# Patient Record
Sex: Male | Born: 1975 | Race: Black or African American | Hispanic: No | Marital: Single | Smoking: Current some day smoker
Health system: Southern US, Community
[De-identification: ages and names within clinical notes are randomized; demographics above are authoritative.]

## PROBLEM LIST (undated history)

## (undated) DIAGNOSIS — I10 Essential (primary) hypertension: Secondary | ICD-10-CM

## (undated) DIAGNOSIS — I2510 Atherosclerotic heart disease of native coronary artery without angina pectoris: Secondary | ICD-10-CM

## (undated) DIAGNOSIS — J40 Bronchitis, not specified as acute or chronic: Secondary | ICD-10-CM

## (undated) HISTORY — PX: CHOLECYSTECTOMY: SHX55

---

## 2017-03-16 ENCOUNTER — Encounter (HOSPITAL_COMMUNITY): Payer: Self-pay

## 2017-03-16 DIAGNOSIS — R42 Dizziness and giddiness: Secondary | ICD-10-CM | POA: Insufficient documentation

## 2017-03-16 DIAGNOSIS — Z5321 Procedure and treatment not carried out due to patient leaving prior to being seen by health care provider: Secondary | ICD-10-CM | POA: Insufficient documentation

## 2017-03-16 LAB — CBC
HCT: 39.4 % (ref 39.0–52.0)
HEMOGLOBIN: 12.9 g/dL — AB (ref 13.0–17.0)
MCH: 27.2 pg (ref 26.0–34.0)
MCHC: 32.7 g/dL (ref 30.0–36.0)
MCV: 82.9 fL (ref 78.0–100.0)
PLATELETS: 303 10*3/uL (ref 150–400)
RBC: 4.75 MIL/uL (ref 4.22–5.81)
RDW: 13.7 % (ref 11.5–15.5)
WBC: 10 10*3/uL (ref 4.0–10.5)

## 2017-03-16 LAB — BASIC METABOLIC PANEL
ANION GAP: 7 (ref 5–15)
BUN: 11 mg/dL (ref 6–20)
CHLORIDE: 103 mmol/L (ref 101–111)
CO2: 29 mmol/L (ref 22–32)
CREATININE: 1.02 mg/dL (ref 0.61–1.24)
Calcium: 9.4 mg/dL (ref 8.9–10.3)
GFR calc non Af Amer: >60 mL/min (ref >60)
Glucose, Bld: 170 mg/dL — ABNORMAL HIGH (ref 65–99)
Potassium: 3.8 mmol/L (ref 3.5–5.1)
SODIUM: 139 mmol/L (ref 135–145)

## 2017-03-16 LAB — URINALYSIS, ROUTINE W REFLEX MICROSCOPIC
Bacteria, UA: NONE SEEN
Bilirubin Urine: NEGATIVE
Glucose, UA: NEGATIVE mg/dL
Ketones, ur: NEGATIVE mg/dL
LEUKOCYTES UA: NEGATIVE
Nitrite: NEGATIVE
PROTEIN: 30 mg/dL — AB
Specific Gravity, Urine: 1.02 (ref 1.005–1.030)
pH: 5 (ref 5.0–8.0)

## 2017-03-16 LAB — I-STAT TROPONIN, ED: TROPONIN I, POC: 0 ng/mL (ref 0.00–0.08)

## 2017-03-16 NOTE — ED Triage Notes (Signed)
Pt states that he has been feeling dizzy for the past three week and CP started today along with SOB/nausea and dizziness. Pt states he just feels week and not good.

## 2017-03-17 ENCOUNTER — Ambulatory Visit (HOSPITAL_COMMUNITY)
Admission: EM | Admit: 2017-03-17 | Discharge: 2017-03-17 | Disposition: A | Discharge status: Home / Self Care | Payer: Self-pay | Attending: Family Medicine | Admitting: Family Medicine

## 2017-03-17 ENCOUNTER — Encounter (HOSPITAL_COMMUNITY): Payer: Self-pay | Admitting: Emergency Medicine

## 2017-03-17 ENCOUNTER — Emergency Department (HOSPITAL_COMMUNITY)
Admission: EM | Admit: 2017-03-17 | Discharge: 2017-03-17 | Disposition: A | Discharge status: Home / Self Care | Payer: Self-pay | Attending: Emergency Medicine | Admitting: Emergency Medicine

## 2017-03-17 DIAGNOSIS — J302 Other seasonal allergic rhinitis: Secondary | ICD-10-CM

## 2017-03-17 HISTORY — DX: Bronchitis, not specified as acute or chronic: J40

## 2017-03-17 MED ORDER — CETIRIZINE HCL 10 MG PO TABS: 10.0000 mg | ORAL_TABLET | Freq: Every day | ORAL | 0 refills | Status: DC

## 2017-03-17 NOTE — ED Notes (Addendum)
Pt asked about wait time. Pt updated

## 2017-03-17 NOTE — ED Triage Notes (Signed)
Pt was seen in the ED last night but LWBS after triage.  Pt had labs drawn and an EKG done.  Pt complains of feeling dizzy last night before work, then feeling numbness in his hands while at work.  Pt reports suffering from a cold for the last three weeks.

## 2017-03-17 NOTE — ED Notes (Signed)
Called for Pt to recheck vitals. No answer x's 4 

## 2017-03-17 NOTE — ED Provider Notes (Signed)
[facility]   [id number] 03/17/17 Arrival Time: 1002  ASSESSMENT & PLAN:  1. Seasonal allergic rhinitis, unspecified trigger     Meds ordered this encounter  Medications  . cetirizine (ZYRTEC) 10 MG tablet    Sig: Take 1 tablet (10 mg total) by mouth daily.    Dispense:  30 tablet    Refill:  0   Will f/u as needed. Reviewed expectations re: course of current medical issues. Questions answered. Outlined signs and symptoms indicating need for more acute intervention. Patient verbalized understanding. After Visit Summary given.   SUBJECTIVE:  Joel Velazquez is a 41 y.o. male who presented to the ED last evening with complaint of feeling lightheaded. Overall he reports 3-4 weeks.of on and off nasal congestion and runny nose. Afebrile. On and off fatigue. Sneezing frequently. Itchy eyes. No OTC treatment. Lightheaded feeling very transient yesterday. None today. Left ED before being seen. No CP/SOB. No specific aggravating or alleviating factors reported. Smokes cigarettes. ROS: As per HPI.   OBJECTIVE:  Vitals:   03/17/17 1021  BP: 133/80  Pulse: 77  Temp: 98.3 F (36.8 C)  TempSrc: Oral  SpO2: 100%     General appearance: alert; no distress Eyes: OS/OD conjunctivae with slight injection HEENT: nasal congestion; clear runny nose; throat irritation secondary to post-nasal drainage Neck: supple without LAD Lungs: clear to auscultation bilaterally Skin: warm and dry Psychological: alert and cooperative; normal mood and affect  Results for orders placed or performed during the hospital encounter of 03/17/17  Basic metabolic panel  Result Value Ref Range   Sodium 139 135 - 145 mmol/L   Potassium 3.8 3.5 - 5.1 mmol/L   Chloride 103 101 - 111 mmol/L   CO2 29 22 - 32 mmol/L   Glucose, Bld 170 (H) 65 - 99 mg/dL   BUN 11 6 - 20 mg/dL   Creatinine, Ser 1.02 0.61 - 1.24 mg/dL   Calcium 9.4 8.9 - 72.5 mg/dL   GFR calc non Af Amer >60 >60 mL/min   GFR calc Af  Amer >60 >60 mL/min   Anion gap 7 5 - 15  CBC  Result Value Ref Range   WBC 10.0 4.0 - 10.5 K/uL   RBC 4.75 4.22 - 5.81 MIL/uL   Hemoglobin 12.9 (L) 13.0 - 17.0 g/dL   HCT 36.6 44.0 - 34.7 %   MCV 82.9 78.0 - 100.0 fL   MCH 27.2 26.0 - 34.0 pg   MCHC 32.7 30.0 - 36.0 g/dL   RDW 42.5 95.6 - 38.7 %   Platelets 303 150 - 400 K/uL  Urinalysis, Routine w reflex microscopic  Result Value Ref Range   Color, Urine YELLOW YELLOW   APPearance CLEAR CLEAR   Specific Gravity, Urine 1.020 1.005 - 1.030   pH 5.0 5.0 - 8.0   Glucose, UA NEGATIVE NEGATIVE mg/dL   Hgb urine dipstick MODERATE (A) NEGATIVE   Bilirubin Urine NEGATIVE NEGATIVE   Ketones, ur NEGATIVE NEGATIVE mg/dL   Protein, ur 30 (A) NEGATIVE mg/dL   Nitrite NEGATIVE NEGATIVE   Leukocytes, UA NEGATIVE NEGATIVE   RBC / HPF 6-30 0 - 5 RBC/hpf   WBC, UA 0-5 0 - 5 WBC/hpf   Bacteria, UA NONE SEEN NONE SEEN   Squamous Epithelial / LPF 0-5 (A) NONE SEEN   Mucus PRESENT   I-Stat Troponin, ED (not at [facility])  Result Value Ref Range   Troponin i, poc 0.00 0.00 - 0.08 ng/mL   Comment 3  No Known Allergies  Past Medical History:  Diagnosis Date  . Bronchitis    Social History   Social History  . Marital status: Single    Spouse name: N/A  . Number of children: N/A  . Years of education: N/A   Occupational History  . Not on file.   Social History Main Topics  . Smoking status: Current Some Day Smoker  . Smokeless tobacco: Never Used  . Alcohol use Yes  . Drug use: Unknown  . Sexual activity: Not on file   Other Topics Concern  . Not on file   Social History Narrative  . No narrative on file           Mardella Layman, MD 03/17/17 1137

## 2017-03-23 ENCOUNTER — Encounter (HOSPITAL_COMMUNITY): Payer: Self-pay | Admitting: Emergency Medicine

## 2017-03-23 ENCOUNTER — Ambulatory Visit (HOSPITAL_COMMUNITY)
Admission: EM | Admit: 2017-03-23 | Discharge: 2017-03-23 | Disposition: A | Discharge status: Home / Self Care | Payer: Self-pay | Attending: Family Medicine | Admitting: Family Medicine

## 2017-03-23 DIAGNOSIS — M25511 Pain in right shoulder: Secondary | ICD-10-CM

## 2017-03-23 MED ORDER — KETOROLAC TROMETHAMINE 30 MG/ML IJ SOLN
INTRAMUSCULAR | Status: AC
  Filled 2017-03-23: qty 1

## 2017-03-23 MED ORDER — MELOXICAM 7.5 MG PO TABS: 7.5000 mg | ORAL_TABLET | Freq: Every day | ORAL | 0 refills | Status: DC

## 2017-03-23 MED ORDER — KETOROLAC TROMETHAMINE 30 MG/ML IJ SOLN
30.0000 mg | Freq: Once | INTRAMUSCULAR | Status: AC
  Administered 2017-03-23: 30 mg via INTRAMUSCULAR

## 2017-03-23 MED ORDER — CYCLOBENZAPRINE HCL 10 MG PO TABS: 5.0000 mg | ORAL_TABLET | Freq: Two times a day (BID) | ORAL | 0 refills | Status: DC | PRN

## 2017-03-23 NOTE — Discharge Instructions (Signed)
Start Mobic as directed. Flexeril as needed at night. Flexeril can make you drowsy, so do not take if you are going to drive, operate heavy machinery, or make important decisions. Ice/heat compresses as needed. This can take up to 3-4 weeks to completely resolve, but you should be feeling better each week. Follow up with orthopedics if symptoms not improving, or does not resolve.

## 2017-03-23 NOTE — ED Triage Notes (Signed)
Pt. Stated , I had a limb/tree on my rt. Shoulder and I went to hospital there, given Tramodol and not working.

## 2017-03-23 NOTE — ED Provider Notes (Signed)
[facility]    CSN: [id number] Arrival date & time: 03/23/17  1608     History   Chief Complaint Chief Complaint  Patient presents with  . Shoulder Injury    HPI Joel Velazquez is a 41 y.o. male.   41 year old male comes in for 3 day history of right shoulder pain. Patient states he was at his grandmother's back porch, was pulling at the short tree limb, which caused a larger one to swing over and hit the back of his right shoulder. He went to the ED in [place], [place] for evaluation. States xray was negative for fracture/dislocation, and was diagnosed with a contusion. Patient states that he was given tramadol but has not been helping with the symptoms. He states it is hard to move his shoulder, and experiences intermittent numbness and tingling radiating down to his arm. He has not noticed increase in swelling. Has been using ice compress without relief. Has had some shortness of breath when pain is the worst, otherwise denies chest pain, trouble breathing, wheezing.       Past Medical History:  Diagnosis Date  . Bronchitis     There are no active problems to display for this patient.   Past Surgical History:  Procedure Laterality Date  . CHOLECYSTECTOMY         Home Medications    Prior to Admission medications   Medication Sig Start Date End Date Taking? Authorizing Provider  traMADol-acetaminophen (ULTRACET) 37.5-325 MG tablet Take 1 tablet by mouth every 6 (six) hours as needed.   Yes [provider]  cetirizine (ZYRTEC) 10 MG tablet Take 1 tablet (10 mg total) by mouth daily. 03/17/17   Mardella Layman, MD  cyclobenzaprine (FLEXERIL) 10 MG tablet Take 0.5-1 tablets (5-10 mg total) by mouth 2 (two) times daily as needed for muscle spasms. 03/23/17   Cathie Hoops, Zada Haser V, PA-C  meloxicam (MOBIC) 7.5 MG tablet Take 1 tablet (7.5 mg total) by mouth daily. 03/23/17   Belinda Fisher, PA-C    Family History No family history on file.  Social History Social  History  Substance Use Topics  . Smoking status: Current Some Day Smoker  . Smokeless tobacco: Never Used  . Alcohol use Yes     Allergies   Patient has no known allergies.   Review of Systems Review of Systems  Reason unable to perform ROS: See HPI as above.     Physical Exam Triage Vital Signs ED Triage Vitals  Enc Vitals Group     BP 03/23/17 1717 126/80     Pulse Rate 03/23/17 1717 76     Resp 03/23/17 1717 17     Temp 03/23/17 1717 98.3 F (36.8 C)     Temp Source 03/23/17 1717 Oral     SpO2 03/23/17 1717 100 %     Weight 03/23/17 1718 155 lb (70.3 kg)     Height 03/23/17 1718  (1.702 m)     Head Circumference --      Peak Flow --      Pain Score 03/23/17 1718 9     Pain Loc --      Pain Edu? --      Excl. in GC? --    No data found.   Updated Vital Signs BP 126/80 (BP Location: Right Arm)   Pulse 76   Temp 98.3 F (36.8 C) (Oral)   Resp 17   Ht  (1.702 m)   Wt  155 lb (70.3 kg)   SpO2 100%   BMI 24.28 kg/m    Physical Exam  Constitutional: He is oriented to person, place, and time. He appears well-developed and well-nourished. No distress.  HENT:  Head: Normocephalic and atraumatic.  Eyes: Pupils are equal, round, and reactive to light. Conjunctivae are normal.  Cardiovascular: Normal rate, regular rhythm and normal heart sounds.  Exam reveals no gallop and no friction rub.   No murmur heard. Pulmonary/Chest: Effort normal and breath sounds normal. No respiratory distress. He has no wheezes. He has no rales.  Musculoskeletal:  Diffuse tenderness to light touch of right shoulder, scapula, upper back that continues to left upper back. Decreased ROM of right shoulder. Unable to test strength. Sensation intact and equal bilaterally. Radial pulses 2+ and equal bilaterally. Cap refill <2s  Neurological: He is alert and oriented to person, place, and time.  Skin: Skin is warm and dry.     UC Treatments / Results  Labs (all labs ordered  are listed, but only abnormal results are displayed) Labs Reviewed - No data to display  EKG  EKG Interpretation None       Radiology No results found.  Procedures Procedures (including critical care time)  Medications Ordered in UC Medications  ketorolac (TORADOL) 30 MG/ML injection 30 mg (30 mg Intramuscular Given 03/23/17 1747)     Initial Impression / Assessment and Plan / UC Course  I have reviewed the triage vital signs and the nursing notes.  Pertinent labs & imaging results that were available during my care of the patient were reviewed by me and considered in my medical decision making (see chart for details).    Toradol injection in office. Start NSAIDs as directed. Muscle relaxant as needed. Discussed with patient this can take up to 3-4 weeks to completely resolve, but should be feeling better each week. Patient to follow up with orthopedics if symptoms do not improve, resolve. Resources given. Return precautions given.  Final Clinical Impressions(s) / UC Diagnoses   Final diagnoses:  Acute pain of right shoulder    New Prescriptions Discharge Medication List as of 03/23/2017  5:39 PM    START taking these medications   Details  cyclobenzaprine (FLEXERIL) 10 MG tablet Take 0.5-1 tablets (5-10 mg total) by mouth 2 (two) times daily as needed for muscle spasms., Starting Tue 03/23/2017, Normal    meloxicam (MOBIC) 7.5 MG tablet Take 1 tablet (7.5 mg total) by mouth daily., Starting Tue 03/23/2017, Normal          Belinda Fisher, PA-C 03/23/17 1955

## 2017-03-25 ENCOUNTER — Ambulatory Visit (INDEPENDENT_AMBULATORY_CARE_PROVIDER_SITE_OTHER): Payer: Self-pay

## 2017-03-25 ENCOUNTER — Encounter (HOSPITAL_COMMUNITY): Payer: Self-pay | Admitting: Emergency Medicine

## 2017-03-25 ENCOUNTER — Ambulatory Visit (HOSPITAL_COMMUNITY)
Admission: EM | Admit: 2017-03-25 | Discharge: 2017-03-25 | Disposition: A | Discharge status: Home / Self Care | Payer: Self-pay | Attending: Family Medicine | Admitting: Family Medicine

## 2017-03-25 DIAGNOSIS — M549 Dorsalgia, unspecified: Secondary | ICD-10-CM

## 2017-03-25 DIAGNOSIS — M62830 Muscle spasm of back: Secondary | ICD-10-CM

## 2017-03-25 DIAGNOSIS — M25511 Pain in right shoulder: Secondary | ICD-10-CM

## 2017-03-25 MED ORDER — PREDNISONE 10 MG (21) PO TBPK: ORAL_TABLET | Freq: Every day | ORAL | 0 refills | Status: DC

## 2017-03-25 MED ORDER — KETOROLAC TROMETHAMINE 60 MG/2ML IM SOLN: 60.0000 mg | Freq: Once | INTRAMUSCULAR | Status: DC

## 2017-03-25 MED ORDER — DIAZEPAM 10 MG PO TABS: 10.0000 mg | ORAL_TABLET | Freq: Every evening | ORAL | 0 refills | Status: DC | PRN

## 2017-03-25 NOTE — ED Triage Notes (Signed)
Pt states he is still having pain in his right shoulder.  Pt was seen here two days ago.  Pt states he has been taking medications as prescribed.  Pt also states he lifts heavy things at work and he is supposed to return tomorrow but doesn't think he will be able to lift anything.  Pt has not called ortho as directed for follow up.

## 2017-03-25 NOTE — ED Provider Notes (Signed)
[facility]   [id number] 03/25/17 Arrival Time: 1256  ASSESSMENT & PLAN:  1. Upper back pain on right side   2. Right shoulder pain, unspecified chronicity   3. Muscle spasm of back     Meds ordered this encounter  Medications  . ketorolac (TORADOL) injection 60 mg  . diazepam (VALIUM) 10 MG tablet    Sig: Take 1 tablet (10 mg total) by mouth at bedtime as needed for anxiety.    Dispense:  7 tablet    Refill:  0  . predniSONE (STERAPRED UNI-PAK 21 TAB) 10 MG (21) TBPK tablet    Sig: Take by mouth daily. Take as directed.    Dispense:  21 tablet    Refill:  0   Work note given. Follow up in 48-72 hours for recheck, sooner if needed. Medication sedation precautions given. Reviewed expectations re: course of current medical issues. Questions answered. Outlined signs and symptoms indicating need for more acute intervention. Patient verbalized understanding. After Visit Summary given.    SUBJECTIVE:  Joel Velazquez is a 41 y.o. male who presents with complaint of upper back discomfort. Onset last week after tree limb fell onto his back while at work. Previous visit documentation reviewed. Continued discomfort to R upper back and shoulder area. Discomfort described as sharp and stiffness without radiation. No respiratory symptoms. Medications given at last visit with mild, temporary help. No extremity sensation changes or weakness. Discomfort worse with certain movements. No specific alleviating factors.  ROS: As per HPI.   OBJECTIVE:  Vitals:   03/25/17 1324  BP: 135/90  Pulse: (!) 102  Temp: 98.2 F (36.8 C)  TempSrc: Oral  SpO2: 100%    General appearance: alert; no distress Abdomen: soft, non-tender Back: midline tenderness over cervical and thoracic spine; FROM of neck Extremities: no cyanosis or edema; symmetrical with no gross deformities Skin: warm and dry Neurologic: normal gait; normal symmetric reflexes; normal LE strength and  sensation Psychological: alert and cooperative; normal mood and affect  Imaging: Dg Cervical Spine Complete  Result Date: 03/25/2017 CLINICAL DATA:  Acute neck pain after injury 4 days ago. EXAM: CERVICAL SPINE - COMPLETE 4+ VIEW COMPARISON:  None. FINDINGS: Reversal of normal lordosis of cervical spine is noted which most likely is positional in origin. No fracture or spondylolisthesis is noted. Moderate degenerative disc disease is noted at C4-5, C5-6 and C6-7. Mild bilateral neural foraminal stenosis is noted at C4-5, C5-6 and C6-7 secondary to uncovertebral spurring. IMPRESSION: Moderate multilevel degenerative disc disease. No acute abnormality seen in the cervical spine. Electronically Signed   By: Lupita Raider, M.D.   On: 03/25/2017 14:06   Dg Thoracic Spine 2 View  Result Date: 03/25/2017 CLINICAL DATA:  Upper back pain after trauma 4 days ago. EXAM: THORACIC SPINE 2 VIEWS COMPARISON:  None. FINDINGS: There is no evidence of thoracic spine fracture. Alignment is normal. No other significant bone abnormalities are identified. IMPRESSION: Normal thoracic spine. Electronically Signed   By: Lupita Raider, M.D.   On: 03/25/2017 14:12    No Known Allergies  Past Medical History:  Diagnosis Date  . Bronchitis    Social History   Social History  . Marital status: Single    Spouse name: N/A  . Number of children: N/A  . Years of education: N/A   Occupational History  . Not on file.   Social History Main Topics  . Smoking status: Current Some Day Smoker  . Smokeless tobacco: Never Used  .  Alcohol use Yes  . Drug use: Unknown  . Sexual activity: Not on file   Other Topics Concern  . Not on file   Social History Narrative  . No narrative on file   Past Surgical History:  Procedure Laterality Date  . Barrington Ellison, MD 03/25/17 1423

## 2017-03-28 ENCOUNTER — Encounter (HOSPITAL_COMMUNITY): Payer: Self-pay | Admitting: Emergency Medicine

## 2017-03-28 ENCOUNTER — Ambulatory Visit (HOSPITAL_COMMUNITY)
Admission: EM | Admit: 2017-03-28 | Discharge: 2017-03-28 | Disposition: A | Discharge status: Home / Self Care | Payer: Self-pay | Attending: Urgent Care | Admitting: Urgent Care

## 2017-03-28 DIAGNOSIS — M5030 Other cervical disc degeneration, unspecified cervical region: Secondary | ICD-10-CM

## 2017-03-28 DIAGNOSIS — S40011D Contusion of right shoulder, subsequent encounter: Secondary | ICD-10-CM

## 2017-03-28 MED ORDER — MELOXICAM 15 MG PO TABS: 15.0000 mg | ORAL_TABLET | Freq: Every day | ORAL | 1 refills | Status: DC

## 2017-03-28 NOTE — ED Triage Notes (Signed)
On 9/23 was pulling on a tree limb and hit in right upper back .  Patient [facility]  Patient is wanting a note with limitations stating what he can do at work.    Patient has not called ortho as instructed for follow up

## 2017-03-29 NOTE — ED Provider Notes (Signed)
MRN: [id number] DOB: [birth year]  Subjective:   Joel Velazquez is a 41 y.o. male presenting for chief complaint of Shoulder Pain  Last OV for same was 03/25/2017. Today, he reports ongoing pain of his right upper back, shoulder and neck. He is currently finishing a steroid course, taking meloxicam and Flexeril. He is using Ultracet for breakthrough pain. Denies radicular pain, redness, fever, drainage of pus or bleeding from his wounds.   [name] has No Known Allergies.  [name]  has a past medical history of Bronchitis. Also  has a past surgical history that includes Cholecystectomy.  Objective:   Vitals: BP (!) 145/95 (BP Location: Left Arm)   Pulse 93   Temp 97.6 F (36.4 C) (Oral)   Resp (!) 22   SpO2 100%   Physical Exam  Constitutional: He is oriented to person, place, and time. He appears well-developed and well-nourished.  Cardiovascular: Normal rate.   Pulmonary/Chest: Effort normal.  Musculoskeletal:       Cervical back: He exhibits decreased range of motion (lateral flexion, rotation to the left), tenderness (over trapezius muscle, contusion as depicted) and spasm (over trapezius and paraspinal muscles). He exhibits no bony tenderness, no swelling, no edema, no deformity and no laceration.       Back:  Negative Spurling maneuver.  Neurological: He is alert and oriented to person, place, and time.   Assessment and Plan :   Contusion of right shoulder, subsequent encounter  Degenerative disc disease, cervical  Patient primarily wanted work note and work restrictions. I provided this to the patient. I also reviewed medications with patient and advised that he stop meloxicam while using steroid course. He can use Ultracet for breakthrough pain. I counseled him on diagnosis of DDD. Return-to-clinic precautions discussed, patient verbalized understanding.   Wallis Bamberg, PA-C [facility]  03/29/2017  12:28 AM   Wallis Bamberg, PA-C 03/29/17 [phone]

## 2017-04-03 ENCOUNTER — Encounter (HOSPITAL_COMMUNITY): Payer: Self-pay

## 2017-04-03 ENCOUNTER — Ambulatory Visit (HOSPITAL_COMMUNITY)
Admission: EM | Admit: 2017-04-03 | Discharge: 2017-04-03 | Disposition: A | Discharge status: Home / Self Care | Payer: Self-pay | Attending: Urgent Care | Admitting: Urgent Care

## 2017-04-03 DIAGNOSIS — M5030 Other cervical disc degeneration, unspecified cervical region: Secondary | ICD-10-CM

## 2017-04-03 DIAGNOSIS — M542 Cervicalgia: Secondary | ICD-10-CM

## 2017-04-03 DIAGNOSIS — M25511 Pain in right shoulder: Secondary | ICD-10-CM

## 2017-04-03 DIAGNOSIS — S40011D Contusion of right shoulder, subsequent encounter: Secondary | ICD-10-CM

## 2017-04-03 MED ORDER — CYCLOBENZAPRINE HCL 10 MG PO TABS: 5.0000 mg | ORAL_TABLET | Freq: Every evening | ORAL | 1 refills | Status: DC | PRN

## 2017-04-03 MED ORDER — MELOXICAM 15 MG PO TABS: 15.0000 mg | ORAL_TABLET | Freq: Every day | ORAL | 1 refills | Status: DC

## 2017-04-03 NOTE — ED Triage Notes (Signed)
Patient has returned to [facility] for rt shoulder pain he was last seen here on 03/28/17 pt states is he still experiencing pain. Patient has not follwed up with Ortho as instructed

## 2017-04-03 NOTE — ED Provider Notes (Signed)
MRN: [id number] DOB: 08-29-1975  Subjective:   Joel Velazquez is a 41 y.o. male presenting for chief complaint of Shoulder Pain  Reports ongoing right shoulder and neck pain albeit improved. Patient has not yet sought orthopedist. He is taking meloxicam  and has used Ultracet but admits that Ultracet does not help as much. Denies trauma, numbness or tingling, radiculopathy, weakness. He is following work restrictions placed for him at his last OV. Does not have a PCP yet.   No current facility-administered medications for this encounter.    Current Outpatient Prescriptions  Medication Sig Dispense Refill  . cetirizine (ZYRTEC) 10 MG tablet Take 1 tablet (10 mg total) by mouth daily. 30 tablet 0  . cyclobenzaprine (FLEXERIL) 10 MG tablet Take 0.5-1 tablets (5-10 mg total) by mouth at bedtime as needed for muscle spasms. 30 tablet 1  . diazepam (VALIUM) 10 MG tablet Take 1 tablet (10 mg total) by mouth at bedtime as needed for anxiety. 7 tablet 0  . meloxicam (MOBIC) 15 MG tablet Take 1 tablet (15 mg total) by mouth daily. 14 tablet 1  . predniSONE (STERAPRED UNI-PAK 21 TAB) 10 MG (21) TBPK tablet Take by mouth daily. Take as directed. 21 tablet 0  . traMADol-acetaminophen (ULTRACET) 37.5-325 MG tablet Take 1 tablet by mouth every 6 (six) hours as needed.       Joel Velazquez has No Known Allergies.  Joel Velazquez  has a past medical history of Bronchitis. Also  has a past surgical history that includes Cholecystectomy.  Objective:   Vitals: BP (!) 124/105 (BP Location: Left Arm)   Pulse 96   Temp 98.1 F (36.7 C) (Oral)   Resp 17   SpO2 100%   Physical Exam  Constitutional: He is oriented to person, place, and time. He appears well-developed and well-nourished.  Cardiovascular: Normal rate.   Pulmonary/Chest: Effort normal.  Musculoskeletal:       Cervical back: He exhibits decreased range of motion, tenderness (along paraspinal mucles and trapezius as depicted) and spasm (over trapezius  muscle). He exhibits no bony tenderness, no swelling, no edema, no deformity and no laceration.       Back:  Neurological: He is alert and oriented to person, place, and time.   Assessment and Plan :   Acute pain of right shoulder  Neck pain  Degenerative disc disease, cervical  Contusion of right shoulder, subsequent encounter  Extended patient's work restrictions. Refilled meloxicam and Flexeril. Patient will contact ortho to obtain an evaluation. He will also consider physical therapy. Return-to-clinic precautions discussed, patient verbalized understanding.   Wallis Bamberg, PA-C [facility]  04/03/2017  4:49 PM    Wallis Bamberg, PA-C 04/03/17 1712

## 2017-04-03 NOTE — ED Notes (Signed)
Patient discharged by provider.

## 2017-04-16 ENCOUNTER — Encounter (HOSPITAL_COMMUNITY): Payer: Self-pay | Admitting: Emergency Medicine

## 2017-04-16 ENCOUNTER — Ambulatory Visit (HOSPITAL_COMMUNITY)
Admission: EM | Admit: 2017-04-16 | Discharge: 2017-04-16 | Disposition: A | Discharge status: Home / Self Care | Payer: Self-pay | Attending: Family | Admitting: Family

## 2017-04-16 DIAGNOSIS — R030 Elevated blood-pressure reading, without diagnosis of hypertension: Secondary | ICD-10-CM

## 2017-04-16 DIAGNOSIS — M25511 Pain in right shoulder: Secondary | ICD-10-CM

## 2017-04-16 MED ORDER — CYCLOBENZAPRINE HCL 10 MG PO TABS: 10.0000 mg | ORAL_TABLET | Freq: Every day | ORAL | 0 refills | Status: DC | PRN

## 2017-04-16 NOTE — Discharge Instructions (Addendum)
May continue Mobic as needed for pain and may use Flexeril 10mg  1 tablet at night if needed for muscle pain/spasms- will cause drowsiness. May return to work without restrictions- note written. Recommend if pain continues, see the Orthopedic as previously recommended.

## 2017-04-16 NOTE — ED Provider Notes (Signed)
[facility]    CSN: [id number] Arrival date & time: 04/16/17  1002     History   Chief Complaint Chief Complaint  Patient presents with  . Letter for School/Work    HPI Joel Velazquez is a 41 y.o. male.   41 year old male presents for follow-up for left shoulder pain that has improved and mostly resolved. He takes Mobic as needed for pain and has used Flexeril as needed with good success. Only has 3 pills left and requests a few more in case he needs them at night. He requests a note to go back to work today without any restrictions. He never saw an Orthopedic since his symptoms have improved with medication. No other chronic health issues.    The history is provided by the patient.    Past Medical History:  Diagnosis Date  . Bronchitis     There are no active problems to display for this patient.   Past Surgical History:  Procedure Laterality Date  . CHOLECYSTECTOMY         Home Medications    Prior to Admission medications   Medication Sig Start Date End Date Taking? Authorizing Provider  cetirizine (ZYRTEC) 10 MG tablet Take 1 tablet (10 mg total) by mouth daily. 03/17/17   Mardella LaymanHagler, Brian, MD  cyclobenzaprine (FLEXERIL) 10 MG tablet Take 1 tablet (10 mg total) by mouth daily as needed for muscle spasms. 04/16/17   Sudie GrumblingAmyot, Teylor Wolven Berry, NP  meloxicam (MOBIC) 15 MG tablet Take 1 tablet (15 mg total) by mouth daily. 04/03/17   Wallis BambergMani, Mario, PA-C    Family History History reviewed. No pertinent family history.  Social History Social History  Substance Use Topics  . Smoking status: Current Some Day Smoker  . Smokeless tobacco: Never Used  . Alcohol use Yes     Allergies   Patient has no known allergies.   Review of Systems Review of Systems  Constitutional: Negative for activity change, fatigue and fever.  Respiratory: Negative for cough, chest tightness, shortness of breath and wheezing.   Cardiovascular: Negative for chest pain and  palpitations.  Musculoskeletal: Positive for arthralgias and myalgias. Negative for back pain, joint swelling, neck pain and neck stiffness.  Skin: Negative for color change, rash and wound.  Allergic/Immunologic: Negative for immunocompromised state.  Neurological: Negative for dizziness, tremors, weakness, numbness and headaches.  Psychiatric/Behavioral: The patient is nervous/anxious.      Physical Exam Triage Vital Signs ED Triage Vitals [04/16/17 1020]  Enc Vitals Group     BP (!) 169/110     Pulse Rate (!) 128     Resp 20     Temp 98.3 F (36.8 C)     Temp Source Oral     SpO2 97 %     Weight      Height      Head Circumference      Peak Flow      Pain Score      Pain Loc      Pain Edu?      Excl. in GC?    No data found.   Updated Vital Signs BP (!) 169/110 (BP Location: Right Arm)   Pulse (!) 128   Temp 98.3 F (36.8 C) (Oral)   Resp 20   SpO2 97%   Visual Acuity Right Eye Distance:   Left Eye Distance:   Bilateral Distance:    Right Eye Near:   Left Eye Near:    Bilateral Near:  Physical Exam  Constitutional: He is oriented to person, place, and time. He appears well-developed and well-nourished. He is cooperative. No distress.  Neck: Normal range of motion. Neck supple.  Cardiovascular: Regular rhythm.  Tachycardia present.   Pulmonary/Chest: Effort normal.  Musculoskeletal: Normal range of motion. He exhibits no edema or tenderness.       Right shoulder: He exhibits normal range of motion, no tenderness, no swelling, no effusion, no crepitus, no deformity, no laceration, no pain, no spasm, normal pulse and normal strength.       Arms: No longer having any pain or tenderness over the right posterior shoulder. Has full range of motion without pain. No numbness or neuro deficits present.   Neurological: He is alert and oriented to person, place, and time. He has normal strength. No sensory deficit.  Skin: Skin is warm and dry.  Psychiatric: His  speech is normal and behavior is normal. Judgment and thought content normal. His mood appears anxious.     UC Treatments / Results  Labs (all labs ordered are listed, but only abnormal results are displayed) Labs Reviewed - No data to display  EKG  EKG Interpretation None       Radiology No results found.  Procedures Procedures (including critical care time)  Medications Ordered in UC Medications - No data to display   Initial Impression / Assessment and Plan / UC Course  I have reviewed the triage vital signs and the nursing notes.  Pertinent labs & imaging results that were available during my care of the patient were reviewed by me and considered in my medical decision making (see chart for details).    May continue Mobic as needed for pain and may use Flexeril 10mg  #15 1 tablet at night if needed for muscle pain/spasms- will cause drowsiness. May return to work without restrictions- note written. Recommend if pain returns, see the Orthopedic as previously recommended.  Also briefly mentioned blood pressure is elevated today- may be due to anxiety. Strongly encouraged to continue to monitor and see a PCP if remains elevated.    Final Clinical Impressions(s) / UC Diagnoses   Final diagnoses:  Pain of right shoulder region  Elevated blood-pressure reading without diagnosis of hypertension    New Prescriptions Discharge Medication List as of 04/16/2017 10:39 AM       Controlled Substance Prescriptions [place] Controlled Substance Registry consulted? No   Sudie Grumbling, NP 04/17/17 [phone]

## 2017-04-16 NOTE — ED Triage Notes (Signed)
Pt reports he is needing a note so he can return to work  Sts he was seen here for right shoulder pain... sts pain has subsided  A&O x4.... NAD... Ambulatory

## 2017-04-16 NOTE — ED Notes (Signed)
Pt on phone when brought back.... Adv him to I would be back when he finishes

## 2017-05-29 ENCOUNTER — Encounter (HOSPITAL_COMMUNITY): Payer: Self-pay | Admitting: Family Medicine

## 2017-05-29 ENCOUNTER — Ambulatory Visit (HOSPITAL_COMMUNITY)
Admission: EM | Admit: 2017-05-29 | Discharge: 2017-05-29 | Disposition: A | Discharge status: Home / Self Care | Payer: Self-pay | Attending: Family Medicine | Admitting: Family Medicine

## 2017-05-29 DIAGNOSIS — S161XXA Strain of muscle, fascia and tendon at neck level, initial encounter: Secondary | ICD-10-CM

## 2017-05-29 MED ORDER — CYCLOBENZAPRINE HCL 5 MG PO TABS: 5.0000 mg | ORAL_TABLET | Freq: Every day | ORAL | 0 refills | Status: DC

## 2017-05-29 MED ORDER — DICLOFENAC SODIUM 75 MG PO TBEC: 75.0000 mg | DELAYED_RELEASE_TABLET | Freq: Two times a day (BID) | ORAL | 0 refills | Status: DC
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
## 2017-07-21 ENCOUNTER — Encounter (HOSPITAL_COMMUNITY): Payer: Self-pay | Admitting: Emergency Medicine

## 2017-07-21 ENCOUNTER — Ambulatory Visit (HOSPITAL_COMMUNITY)
Admission: EM | Admit: 2017-07-21 | Discharge: 2017-07-21 | Disposition: A | Discharge status: Home / Self Care | Payer: 59 | Attending: Family Medicine | Admitting: Family Medicine

## 2017-07-21 DIAGNOSIS — R69 Illness, unspecified: Secondary | ICD-10-CM

## 2017-07-21 DIAGNOSIS — J111 Influenza due to unidentified influenza virus with other respiratory manifestations: Secondary | ICD-10-CM

## 2017-07-21 MED ORDER — OSELTAMIVIR PHOSPHATE 75 MG PO CAPS: 75.0000 mg | ORAL_CAPSULE | Freq: Two times a day (BID) | ORAL | 0 refills | Status: DC

## 2017-07-21 MED ORDER — ONDANSETRON 4 MG PO TBDP: 4.0000 mg | ORAL_TABLET | Freq: Three times a day (TID) | ORAL | 0 refills | Status: DC | PRN

## 2017-07-21 NOTE — ED Triage Notes (Signed)
PT C/O: cold sx onset 2 days associated w/fevers, cough, nasal congestion/drainage, vomiting, decreased appetite, abd pain  TAKING MEDS: Theraflu  A&O x4... NAD... Ambulatory

## 2017-07-21 NOTE — ED Provider Notes (Signed)
  [facility]   [id number] 07/21/17 Arrival Time: 1006  ASSESSMENT & PLAN:  1. Influenza-like illness     Meds ordered this encounter  Medications  . oseltamivir (TAMIFLU) 75 MG capsule    Sig: Take 1 capsule (75 mg total) by mouth every 12 (twelve) hours.    Dispense:  10 capsule    Refill:  0  . ondansetron (ZOFRAN-ODT) 4 MG disintegrating tablet    Sig: Take 1 tablet (4 mg total) by mouth every 8 (eight) hours as needed for nausea or vomiting.    Dispense:  15 tablet    Refill:  0   Work note given. Discussed typical duration of symptoms. OTC symptom care as needed. Ensure adequate fluid intake and rest. May f/u with PCP or here as needed.  Reviewed expectations re: course of current medical issues. Questions answered. Outlined signs and symptoms indicating need for more acute intervention. Patient verbalized understanding. After Visit Summary given.   SUBJECTIVE: History from: patient.  Joel Velazquez is a 42 y.o. male who presents with complaint of nasal congestion, post-nasal drainage, and a persistent dry cough. Onset abrupt, approximately 2 days ago. Overall fatigued with body aches. SOB: none. Wheezing: none. Fever: questions subjective. Overall normal PO intake without vomiting; does report some nausea secondary to post nasal drainage. Sick contacts: yes, relatives with similar. OTC treatment: None.  Received flu shot this year: no.  Social History   Tobacco Use  Smoking Status Current Some Day Smoker  Smokeless Tobacco Never Used    ROS: As per HPI.   OBJECTIVE:  Vitals:   07/21/17 1036  BP: (!) 170/91  Pulse: 84  Resp: 16  Temp: 98.4 F (36.9 C)  TempSrc: Oral  SpO2: 99%     General appearance: alert; appears fatigued HEENT: nasal congestion; clear runny nose; throat irritation secondary to post-nasal drainage Neck: supple without LAD Lungs: unlabored respirations, symmetrical air entry; cough: mild; no respiratory  distress Skin: warm and dry Psychological: alert and cooperative; normal mood and affect   No Known Allergies  Past Medical History:  Diagnosis Date  . Bronchitis    Family History  Problem Relation Age of Onset  . Hypertension Mother   . Diabetes Mother   . Hypertension Father    Social History   Socioeconomic History  . Marital status: Single    Spouse name: Not on file  . Number of children: Not on file  . Years of education: Not on file  . Highest education level: Not on file  Social Needs  . Financial resource strain: Not on file  . Food insecurity - worry: Not on file  . Food insecurity - inability: Not on file  . Transportation needs - medical: Not on file  . Transportation needs - non-medical: Not on file  Occupational History  . Not on file  Tobacco Use  . Smoking status: Current Some Day Smoker  . Smokeless tobacco: Never Used  Substance and Sexual Activity  . Alcohol use: Yes  . Drug use: Yes    Types: Marijuana  . Sexual activity: Not on file  Other Topics Concern  . Not on file  Social History Narrative  . Not on file           Mardella LaymanHagler, Khanh Cordner, MD 07/21/17 1052

## 2017-07-21 NOTE — Discharge Instructions (Signed)
Follow up with your primary care doctor or here if you are not seeing improvement of your symptoms over the next several days, sooner if you feel you are worsening.  Caring for yourself: Get plenty of rest. Drink plenty of fluids, enough so that your urine is light yellow or clear like water. If you have kidney, heart, or liver disease and have to limit fluids, talk with your doctor before you increase the amount of fluids you drink. Take an over-the-counter pain medicine if needed, such as acetaminophen (Tylenol), ibuprofen (Advil, Motrin), or naproxen (Aleve), to relieve fever, headache, and muscle aches. Read and follow all instructions on the label. No one younger than 20 should take aspirin. It has been linked to Reye syndrome, a serious illness. Before you use over the counter cough and cold medicines, check the label. These medicines may not be safe for children younger than age 496 or for people with certain health problems. If the skin around your nose and lips becomes sore, put some petroleum jelly on the area.  Avoid spreading the flu: Wash your hands regularly, and keep your hands away from your face.  Stay home from school, work, and other public places until you are feeling better and your fever has been gone for at least 24 hours. The fever needs to have gone away on its own without the help of medicine.a

## 2017-12-10 ENCOUNTER — Encounter (HOSPITAL_COMMUNITY): Payer: Self-pay | Admitting: Emergency Medicine

## 2017-12-10 ENCOUNTER — Ambulatory Visit (HOSPITAL_COMMUNITY)
Admission: EM | Admit: 2017-12-10 | Discharge: 2017-12-10 | Disposition: A | Discharge status: Home / Self Care | Payer: Managed Care, Other (non HMO) | Attending: Family Medicine | Admitting: Family Medicine

## 2017-12-10 DIAGNOSIS — B9789 Other viral agents as the cause of diseases classified elsewhere: Secondary | ICD-10-CM

## 2017-12-10 DIAGNOSIS — I10 Essential (primary) hypertension: Secondary | ICD-10-CM

## 2017-12-10 DIAGNOSIS — J069 Acute upper respiratory infection, unspecified: Secondary | ICD-10-CM

## 2017-12-10 MED ORDER — FLUTICASONE PROPIONATE 50 MCG/ACT NA SUSP: 1.0000 | Freq: Every day | NASAL | 0 refills | Status: DC

## 2017-12-10 MED ORDER — ONDANSETRON 4 MG PO TBDP
4.0000 mg | ORAL_TABLET | Freq: Once | ORAL | Status: AC
Start: 2017-12-10 — End: 2017-12-10
  Administered 2017-12-10: 4 mg via ORAL

## 2017-12-10 MED ORDER — CETIRIZINE HCL 10 MG PO CHEW: 10.0000 mg | CHEWABLE_TABLET | Freq: Every day | ORAL | 0 refills | Status: AC

## 2017-12-10 MED ORDER — ALBUTEROL SULFATE HFA 108 (90 BASE) MCG/ACT IN AERS
INHALATION_SPRAY | RESPIRATORY_TRACT | Status: AC
  Filled 2017-12-10: qty 6.7

## 2017-12-10 MED ORDER — ALBUTEROL SULFATE HFA 108 (90 BASE) MCG/ACT IN AERS
2.0000 | INHALATION_SPRAY | Freq: Once | RESPIRATORY_TRACT | Status: AC
  Administered 2017-12-10: 2 via RESPIRATORY_TRACT

## 2017-12-10 MED ORDER — ONDANSETRON 4 MG PO TBDP
ORAL_TABLET | ORAL | Status: AC
  Filled 2017-12-10: qty 1

## 2017-12-10 MED ORDER — BENZONATATE 100 MG PO CAPS: 100.0000 mg | ORAL_CAPSULE | Freq: Three times a day (TID) | ORAL | 0 refills | Status: DC | PRN

## 2017-12-10 NOTE — Discharge Instructions (Signed)
Inhaler given in office.  Use as needed for shortness of breath and wheezing Patient requests medication for nausea.  Zofran given in office Zyrtec prescribed, use as needed for relief of allergy symptoms Flonase prescribed, use as needed for relief of nasal symptoms Tessalon perles prescribed, use as needed for relief of cough Primary care provider assistance initiated to establish care Return or go to the ER if you have any new or worsening symptoms  Blood pressure elevated in office.  Please recheck in 24 hours.  If it continues to be greater than 140/90 please follow up with PCP for further evaluation and management.

## 2017-12-10 NOTE — ED Triage Notes (Signed)
Pt sts URI sx x 3 days  

## 2017-12-10 NOTE — ED Provider Notes (Signed)
[facility]   [id number] 12/10/17 Arrival Time: 1505  SUBJECTIVE: History from: patient.  Joel Velazquez is a 42 y.o. male who presents with abrupt onset of nasal congestion, sore throat, and persistent dry cough for 3 days ago.  Denies positive sick exposure or precipitating event.  Has tried OTC medications for cold and allergy relief with temporary improvement.  Symptoms are made worse with eating.  Reports previous symptoms in the past and diagnosed with bronchitis.   Complains of subjective fever, chills, sinus pain, rhinorrhea, fatigue, SOB, wheezing, and nausea.  Denies chest pain, changes in bowel or bladder habits.    ROS: As per HPI.  Past Medical History:  Diagnosis Date  . Bronchitis    Past Surgical History:  Procedure Laterality Date  . CHOLECYSTECTOMY     No Known Allergies No current facility-administered medications on file prior to encounter.    Current Outpatient Medications on File Prior to Encounter  Medication Sig Dispense Refill  . cyclobenzaprine (FLEXERIL) 5 MG tablet Take 1 tablet (5 mg total) by mouth at bedtime. (Patient not taking: Reported on 12/10/2017) 7 tablet 0  . diclofenac (VOLTAREN) 75 MG EC tablet Take 1 tablet (75 mg total) by mouth 2 (two) times daily. (Patient not taking: Reported on 12/10/2017) 14 tablet 0  . ondansetron (ZOFRAN-ODT) 4 MG disintegrating tablet Take 1 tablet (4 mg total) by mouth every 8 (eight) hours as needed for nausea or vomiting. (Patient not taking: Reported on 12/10/2017) 15 tablet 0  . oseltamivir (TAMIFLU) 75 MG capsule Take 1 capsule (75 mg total) by mouth every 12 (twelve) hours. (Patient not taking: Reported on 12/10/2017) 10 capsule 0   Social History   Socioeconomic History  . Marital status: Single    Spouse name: Not on file  . Number of children: Not on file  . Years of education: Not on file  . Highest education level: Not on file  Occupational History  . Not on file  Social Needs  . Financial  resource strain: Not on file  . Food insecurity:    Worry: Not on file    Inability: Not on file  . Transportation needs:    Medical: Not on file    Non-medical: Not on file  Tobacco Use  . Smoking status: Current Some Day Smoker  . Smokeless tobacco: Never Used  Substance and Sexual Activity  . Alcohol use: Yes  . Drug use: Yes    Types: Marijuana  . Sexual activity: Not on file  Lifestyle  . Physical activity:    Days per week: Not on file    Minutes per session: Not on file  . Stress: Not on file  Relationships  . Social connections:    Talks on phone: Not on file    Gets together: Not on file    Attends religious service: Not on file    Active member of club or organization: Not on file    Attends meetings of clubs or organizations: Not on file    Relationship status: Not on file  . Intimate partner violence:    Fear of current or ex partner: Not on file    Emotionally abused: Not on file    Physically abused: Not on file    Forced sexual activity: Not on file  Other Topics Concern  . Not on file  Social History Narrative  . Not on file   Family History  Problem Relation Age of Onset  . Hypertension Mother   .  Diabetes Mother   . Hypertension Father     OBJECTIVE:  Vitals:   12/10/17 1522  BP: (!) 167/99  Pulse: 77  Resp: 18  Temp: 98.8 F (37.1 C)  TempSrc: Oral  SpO2: 97%     General appearance: alert; appears fatigued HEENT: Ears: EACs clear, TMs pearly gray with visible cone of light, without erythema; Eyes: PERRL, EOMI grossly; Sinuses nontender to palpation; Nose: clear rhinorrhea with boggy turbinates; Throat: oropharynx mildly erythematous, tonsils 1+ without white tonsillar exudates, uvula midline Neck: supple without LAD  Lungs: CTA bilaterally without adventitous breath sounds; mild tenderness with AP compression Heart: regular rate and rhythm.  Radial pulses 2+ symmetrical bilaterally Abdomen: normal active bowel sounds; nontender with  palpation; no guarding Skin: warm and dry Psychological: alert and cooperative; normal mood and affect   ASSESSMENT & PLAN:  1. Viral URI with cough   2. Essential hypertension     Meds ordered this encounter  Medications  . albuterol (PROVENTIL HFA;VENTOLIN HFA) 108 (90 Base) MCG/ACT inhaler 2 puff  . ondansetron (ZOFRAN-ODT) disintegrating tablet 4 mg  . cetirizine (ZYRTEC) 10 MG chewable tablet    Sig: Chew 1 tablet (10 mg total) by mouth daily.    Dispense:  20 tablet    Refill:  0    Order Specific Question:   Supervising Provider    Answer:   Isa RankinMURRAY, LAURA WILSON [phone][[id number]]  . fluticasone (FLONASE) 50 MCG/ACT nasal spray    Sig: Place 1 spray into both nostrils daily.    Dispense:  16 g    Refill:  0    Order Specific Question:   Supervising Provider    Answer:   Isa RankinMURRAY, LAURA WILSON [phone][[id number]]  . benzonatate (TESSALON) 100 MG capsule    Sig: Take 1 capsule (100 mg total) by mouth every 8 (eight) hours as needed for cough.    Dispense:  21 capsule    Refill:  0    Order Specific Question:   Supervising Provider    Answer:   Isa RankinMURRAY, LAURA WILSON [[id number]][[id number]]    Inhaler given in office.  Use as needed for shortness of breath and wheezing Patient requests medication for nausea.  Zofran given in office Zyrtec prescribed, use as needed for relief of allergy symptoms Flonase prescribed, use as needed for relief of nasal symptoms Tessalon perles prescribed, use as needed for relief of cough Primary care provider assistance initiated to establish care Return or go to the ER if you have any new or worsening symptoms  Blood pressure elevated in office.  Please recheck in 24 hours.  If it continues to be greater than 140/90 please follow up with PCP for further evaluation and management.    Reviewed expectations re: course of current medical issues. Questions answered. Outlined signs and symptoms indicating need for more acute intervention. Patient verbalized understanding. After  Visit Summary given.         Rennis HardingWurst, Sumayah Bearse, PA-C 12/10/17 1606

## 2018-06-02 ENCOUNTER — Ambulatory Visit (HOSPITAL_COMMUNITY)
Admission: EM | Admit: 2018-06-02 | Discharge: 2018-06-02 | Disposition: A | Discharge status: Home / Self Care | Payer: 59 | Attending: Family Medicine | Admitting: Family Medicine

## 2018-06-02 ENCOUNTER — Other Ambulatory Visit: Payer: Self-pay

## 2018-06-02 ENCOUNTER — Encounter (HOSPITAL_COMMUNITY): Payer: Self-pay | Admitting: Emergency Medicine

## 2018-06-02 DIAGNOSIS — B349 Viral infection, unspecified: Secondary | ICD-10-CM

## 2018-06-02 DIAGNOSIS — I10 Essential (primary) hypertension: Secondary | ICD-10-CM

## 2018-06-02 MED ORDER — IBUPROFEN 800 MG PO TABS: 800.0000 mg | ORAL_TABLET | Freq: Three times a day (TID) | ORAL | 0 refills | Status: DC

## 2018-06-02 MED ORDER — ENALAPRIL MALEATE 5 MG PO TABS: 5.0000 mg | ORAL_TABLET | Freq: Every day | ORAL | 0 refills | Status: AC

## 2018-06-02 MED ORDER — ONDANSETRON 4 MG PO TBDP
ORAL_TABLET | ORAL | Status: AC
  Filled 2018-06-02: qty 1

## 2018-06-02 MED ORDER — ONDANSETRON 4 MG PO TBDP
4.0000 mg | ORAL_TABLET | Freq: Once | ORAL | Status: AC
  Administered 2018-06-02: 4 mg via ORAL

## 2018-06-02 MED ORDER — ONDANSETRON HCL 4 MG PO TABS: 4.0000 mg | ORAL_TABLET | Freq: Four times a day (QID) | ORAL | 0 refills | Status: DC

## 2018-06-02 NOTE — ED Provider Notes (Signed)
[facility]    CSN: [id number] Arrival date & time: 06/02/18  0848     History   [id number] Complaint Chief Complaint  Patient presents with  . Hypertension    HPI Joel Velazquez is a 42 y.o. male.   HPI  Patient is here for blood pressure check.  He went to donate plasma and his blood pressure is too high.  As I look through his medical record his blood pressure is been elevated the last 3 visits here.  He states he used to take blood pressure medication, but was taken off of this medicine when he had a preemployment physical.  He also has been sick with vomiting, diarrhea, and headache for the last couple of days.  He thinks this is from the flu since his nephew had the same.  The symptoms have improved. No known complications from the blood pressure.  Blood pressure does run in his family. Last 3 blood pressures here 169/105, 167/99, 170/91 dating back to January 2019.  I told the patient this is a clear indication of ongoing hypertension that requires treatment.  We discussed that not treating his elevated blood pressure will lead to cardiovascular complications and possible heart disease.  Past Medical History:  Diagnosis Date  . Bronchitis     There are no active problems to display for this patient.   Past Surgical History:  Procedure Laterality Date  . CHOLECYSTECTOMY         Home Medications    Prior to Admission medications   Medication Sig Start Date End Date Taking? Authorizing Provider  cetirizine (ZYRTEC) 10 MG chewable tablet Chew 1 tablet (10 mg total) by mouth daily. 12/10/17   Wurst, [place], PA-C  enalapril (VASOTEC) 5 MG tablet Take 1 tablet (5 mg total) by mouth daily. 06/02/18   Eustace MooreNelson, [place] Sue, MD  ibuprofen (ADVIL,MOTRIN) 800 MG tablet Take 1 tablet (800 mg total) by mouth 3 (three) times daily. 06/02/18   Eustace MooreNelson, Hanna Ra Sue, MD  ondansetron (ZOFRAN) 4 MG tablet Take 1 tablet (4 mg total) by mouth every 6 (six) hours. 06/02/18   Eustace MooreNelson,  Tassie Pollett Sue, MD    Family History Family History  Problem Relation Age of Onset  . Hypertension Mother   . Diabetes Mother   . Hypertension Father     Social History Social History   Tobacco Use  . Smoking status: Current Some Day Smoker  . Smokeless tobacco: Never Used  Substance Use Topics  . Alcohol use: Yes  . Drug use: Yes    Types: Marijuana     Allergies   Patient has no known allergies.   Review of Systems Review of Systems  Constitutional: Negative for chills and fever.  HENT: Negative for ear pain and sore throat.   Eyes: Negative for photophobia, pain and visual disturbance.  Respiratory: Negative for cough and shortness of breath.   Cardiovascular: Negative for chest pain and palpitations.  Gastrointestinal: Positive for diarrhea, nausea and vomiting. Negative for abdominal pain.  Genitourinary: Negative for dysuria and hematuria.  Musculoskeletal: Negative for arthralgias and back pain.  Skin: Negative for color change and rash.  Neurological: Positive for headaches. Negative for seizures and syncope.  All other systems reviewed and are negative.    Physical Exam Triage Vital Signs ED Triage Vitals  Enc Vitals Group     BP 06/02/18 1005 (!) 169/105     Pulse Rate 06/02/18 1005 86     Resp 06/02/18 1005 16  Temp 06/02/18 1005 98 F (36.7 C)     Temp Source 06/02/18 1005 Oral     SpO2 06/02/18 1005 100 %     Weight 06/02/18 1006 150 lb (68 kg)     Height --      Head Circumference --      Peak Flow --      Pain Score 06/02/18 1006 5     Pain Loc --      Pain Edu? --      Excl. in GC? --    No data found.  Updated Vital Signs BP (!) 169/105   Pulse 86   Temp 98 F (36.7 C) (Oral)   Resp 16   Wt 68 kg   SpO2 100%   BMI 23.49 kg/m      Physical Exam  Constitutional: He appears well-developed and well-nourished. No distress.  HENT:  Head: Normocephalic and atraumatic.  Mouth/Throat: Oropharynx is clear and moist.  Eyes:  Pupils are equal, round, and reactive to light. Conjunctivae are normal.  Disks are flat  Neck: Normal range of motion.  Cardiovascular: Normal rate and regular rhythm.  Pulmonary/Chest: Effort normal and breath sounds normal. No respiratory distress.  Abdominal: Soft. He exhibits no distension.  Musculoskeletal: Normal range of motion. He exhibits no edema.  Neurological: He is alert.  Skin: Skin is warm and dry.  Psychiatric: He has a normal mood and affect. His behavior is normal.     UC Treatments / Results  Labs (all labs ordered are listed, but only abnormal results are displayed) Labs Reviewed - No data to display  EKG None  Radiology No results found.  Procedures Procedures (including critical care time)  Medications Ordered in UC Medications  ondansetron (ZOFRAN-ODT) disintegrating tablet 4 mg (4 mg Oral Given 06/02/18 1037)    Initial Impression / Assessment and Plan / UC Course  I have reviewed the triage vital signs and the nursing notes.  Pertinent labs & imaging results that were available during my care of the patient were reviewed by me and considered in my medical decision making (see chart for details).    I discussed with the patient the importance of ongoing management of his hypertension.  He needs to take blood pressure medication and stay on this.  He needs to see a family practice or internal medicine primary care doctor to follow the blood pressure, watch for complications, do regular examinations, and blood work to monitor his blood pressure control.* Final Clinical Impressions(s) / UC Diagnoses   Final diagnoses:  Essential hypertension  Viral illness     Discharge Instructions     Take the BP medicine every day See a PCP for follow up - you need to stay on BP medicine  Take the zofran as needed nausea Take the ibuprofen as needed headache / pain   ED Prescriptions    Medication Sig Dispense Auth. Provider   enalapril (VASOTEC) 5 MG  tablet Take 1 tablet (5 mg total) by mouth daily. 30 tablet Eustace Moore, MD   ibuprofen (ADVIL,MOTRIN) 800 MG tablet Take 1 tablet (800 mg total) by mouth 3 (three) times daily. 21 tablet Eustace Moore, MD   ondansetron (ZOFRAN) 4 MG tablet Take 1 tablet (4 mg total) by mouth every 6 (six) hours. 12 tablet Eustace Moore, MD     Controlled Substance Prescriptions [place] Controlled Substance Registry consulted? Not Applicable   Eustace Moore, MD 06/02/18 2051

## 2018-06-02 NOTE — Discharge Instructions (Addendum)
Take the BP medicine every day See a PCP for follow up - you need to stay on BP medicine  Take the zofran as needed nausea Take the ibuprofen as needed headache / pain

## 2018-06-02 NOTE — ED Triage Notes (Signed)
PT tried to donate plasma and was told his BP was too high. PT reports vomiting, headaches that started Sunday.

## 2018-06-05 ENCOUNTER — Emergency Department (HOSPITAL_COMMUNITY): Payer: Managed Care, Other (non HMO)

## 2018-06-05 ENCOUNTER — Encounter (HOSPITAL_COMMUNITY): Payer: Self-pay

## 2018-06-05 ENCOUNTER — Emergency Department (HOSPITAL_COMMUNITY)
Admission: EM | Admit: 2018-06-05 | Discharge: 2018-06-05 | Disposition: A | Discharge status: Home / Self Care | Payer: Managed Care, Other (non HMO) | Attending: Emergency Medicine | Admitting: Emergency Medicine

## 2018-06-05 ENCOUNTER — Other Ambulatory Visit: Payer: Self-pay

## 2018-06-05 DIAGNOSIS — I10 Essential (primary) hypertension: Secondary | ICD-10-CM | POA: Insufficient documentation

## 2018-06-05 DIAGNOSIS — R079 Chest pain, unspecified: Secondary | ICD-10-CM

## 2018-06-05 DIAGNOSIS — F1290 Cannabis use, unspecified, uncomplicated: Secondary | ICD-10-CM | POA: Diagnosis not present

## 2018-06-05 DIAGNOSIS — R0602 Shortness of breath: Secondary | ICD-10-CM | POA: Diagnosis not present

## 2018-06-05 DIAGNOSIS — Z79899 Other long term (current) drug therapy: Secondary | ICD-10-CM | POA: Insufficient documentation

## 2018-06-05 DIAGNOSIS — R1013 Epigastric pain: Secondary | ICD-10-CM | POA: Insufficient documentation

## 2018-06-05 DIAGNOSIS — K859 Acute pancreatitis without necrosis or infection, unspecified: Secondary | ICD-10-CM | POA: Diagnosis present

## 2018-06-05 DIAGNOSIS — I2510 Atherosclerotic heart disease of native coronary artery without angina pectoris: Secondary | ICD-10-CM | POA: Insufficient documentation

## 2018-06-05 DIAGNOSIS — F17210 Nicotine dependence, cigarettes, uncomplicated: Secondary | ICD-10-CM | POA: Insufficient documentation

## 2018-06-05 DIAGNOSIS — R0789 Other chest pain: Secondary | ICD-10-CM | POA: Diagnosis present

## 2018-06-05 HISTORY — DX: Essential (primary) hypertension: I10

## 2018-06-05 HISTORY — DX: Atherosclerotic heart disease of native coronary artery without angina pectoris: I25.10

## 2018-06-05 LAB — LIPASE, BLOOD: Lipase: 166 U/L — ABNORMAL HIGH (ref 11–51)

## 2018-06-05 LAB — COMPREHENSIVE METABOLIC PANEL
ALK PHOS: 58 U/L (ref 38–126)
ALT: 26 U/L (ref 0–44)
AST: 29 U/L (ref 15–41)
Albumin: 3.6 g/dL (ref 3.5–5.0)
Anion gap: 12 (ref 5–15)
BILIRUBIN TOTAL: 0.5 mg/dL (ref 0.3–1.2)
BUN: 9 mg/dL (ref 6–20)
CO2: 23 mmol/L (ref 22–32)
Calcium: 8.8 mg/dL — ABNORMAL LOW (ref 8.9–10.3)
Chloride: 102 mmol/L (ref 98–111)
Creatinine, Ser: 0.96 mg/dL (ref 0.61–1.24)
GFR calc Af Amer: >60 mL/min (ref >60)
Glucose, Bld: 117 mg/dL — ABNORMAL HIGH (ref 70–99)
Potassium: 3.2 mmol/L — ABNORMAL LOW (ref 3.5–5.1)
Sodium: 137 mmol/L (ref 135–145)
Total Protein: 6.5 g/dL (ref 6.5–8.1)

## 2018-06-05 LAB — CBC WITH DIFFERENTIAL/PLATELET
ABS IMMATURE GRANULOCYTES: 0.02 10*3/uL (ref 0.00–0.07)
Basophils Absolute: 0.1 10*3/uL (ref 0.0–0.1)
Basophils Relative: 1 %
Eosinophils Absolute: 0.4 10*3/uL (ref 0.0–0.5)
Eosinophils Relative: 4 %
HCT: 46.1 % (ref 39.0–52.0)
Hemoglobin: 14.5 g/dL (ref 13.0–17.0)
Immature Granulocytes: 0 %
Lymphocytes Relative: 33 %
Lymphs Abs: 3.2 10*3/uL (ref 0.7–4.0)
MCH: 26.3 pg (ref 26.0–34.0)
MCHC: 31.5 g/dL (ref 30.0–36.0)
MCV: 83.5 fL (ref 80.0–100.0)
Monocytes Absolute: 0.8 10*3/uL (ref 0.1–1.0)
Monocytes Relative: 8 %
Neutro Abs: 5.3 10*3/uL (ref 1.7–7.7)
Neutrophils Relative %: 54 %
Platelets: 357 10*3/uL (ref 150–400)
RBC: 5.52 MIL/uL (ref 4.22–5.81)
RDW: 12.4 % (ref 11.5–15.5)
WBC: 9.6 10*3/uL (ref 4.0–10.5)
nRBC: 0 % (ref 0.0–0.2)

## 2018-06-05 LAB — I-STAT CHEM 8, ED
BUN: 9 mg/dL (ref 6–20)
Calcium, Ion: 1.11 mmol/L — ABNORMAL LOW (ref 1.15–1.40)
Chloride: 103 mmol/L (ref 98–111)
Creatinine, Ser: 1 mg/dL (ref 0.61–1.24)
Glucose, Bld: 113 mg/dL — ABNORMAL HIGH (ref 70–99)
HCT: 45 % (ref 39.0–52.0)
Hemoglobin: 15.3 g/dL (ref 13.0–17.0)
Potassium: 3.1 mmol/L — ABNORMAL LOW (ref 3.5–5.1)
Sodium: 139 mmol/L (ref 135–145)
TCO2: 26 mmol/L (ref 22–32)

## 2018-06-05 LAB — I-STAT TROPONIN, ED: Troponin i, poc: 0 ng/mL (ref 0.00–0.08)

## 2018-06-05 LAB — RAPID URINE DRUG SCREEN, HOSP PERFORMED
Amphetamines: NOT DETECTED
Barbiturates: NOT DETECTED
Benzodiazepines: NOT DETECTED
Cocaine: NOT DETECTED
Opiates: NOT DETECTED
Tetrahydrocannabinol: POSITIVE — AB

## 2018-06-05 MED ORDER — HYDROCODONE-ACETAMINOPHEN 5-325 MG PO TABS: 1.0000 | ORAL_TABLET | Freq: Four times a day (QID) | ORAL | 0 refills | Status: DC | PRN

## 2018-06-05 MED ORDER — IOPAMIDOL (ISOVUE-370) INJECTION 76%
INTRAVENOUS | Status: AC
  Filled 2018-06-05: qty 100

## 2018-06-05 MED ORDER — MORPHINE SULFATE (PF) 4 MG/ML IV SOLN
4.0000 mg | Freq: Once | INTRAVENOUS | Status: AC
  Administered 2018-06-05: 4 mg via INTRAVENOUS
  Filled 2018-06-05: qty 1

## 2018-06-05 MED ORDER — SODIUM CHLORIDE 0.9 % IV BOLUS
1000.0000 mL | Freq: Once | INTRAVENOUS | Status: AC
  Administered 2018-06-05: 1000 mL via INTRAVENOUS

## 2018-06-05 MED ORDER — ONDANSETRON HCL 4 MG PO TABS: 4.0000 mg | ORAL_TABLET | Freq: Four times a day (QID) | ORAL | 0 refills | Status: DC

## 2018-06-05 MED ORDER — IOPAMIDOL (ISOVUE-370) INJECTION 76%
100.0000 mL | Freq: Once | INTRAVENOUS | Status: AC | PRN
  Administered 2018-06-05: 100 mL via INTRAVENOUS

## 2018-06-05 MED ORDER — ONDANSETRON HCL 4 MG/2ML IJ SOLN
4.0000 mg | Freq: Once | INTRAMUSCULAR | Status: AC
  Administered 2018-06-05: 4 mg via INTRAVENOUS
  Filled 2018-06-05: qty 2

## 2018-06-05 NOTE — ED Notes (Signed)
Patient verbalizes understanding of discharge instructions. Opportunity for questioning and answers were provided. Armband removed by staff, pt discharged from ED.  

## 2018-06-05 NOTE — ED Provider Notes (Signed)
[facility] EMERGENCY DEPARTMENT Provider Note   CSN: [id number] Arrival date & time: 06/05/18  1743     History   Chief Complaint Chief Complaint  Patient presents with  . Chest Pain  . Shortness of Breath    HPI Joel Velazquez is a 42 y.o. male the medical history as below of hypertension and CAD who presents emergency department today for chest pain shortness of breath.  Patient reports the last week he felt like he was getting a flulike illness.  He reports that he started developing nasal congestion, rhinorrhea, generalized body aches, shortness of breath, cough, abdominal cramping, nausea, vomiting and diarrhea.  He notes the symptoms resolved however he continued to feel short of breath.  He reports he went to donate his plasma and was told that his blood pressure was too high.  Hematuria urgent care for this where he was noted to be hypertensive and was started on blood pressure medication on 12/5.  He has been compliant with this but reports that blood pressures have continued to elevate.  Over the last 2 days patient has developed some chest pain.  He reports that his chest pain is constant, substernal/left-sided that radiates to his back and also his epigastrium.  He reports that the pain was worsened when he was walking to the hospital today and he also had associated shortness of breath.  He reports occasional nausea.  He describes this pain as pressure.  Patient denies a history of similar pain in the past.  He denies history of MI, CVA or TIA.  He denies prior history of cardiac work-up including echocardiogram, stress test or cardiac catheterization.  He denies tobacco use.  He denies any illicit drug use.  He denies any family history of early CAD.  Patient denies any fever, chills, recent surgery, recent travel, immobilization, exogenous testosterone use, lower extremity swelling, hemoptysis, prior history of PE/DVT.  He notes that his flulike symptoms from last  week are resolving.  This is also consistent with patient's urgent care notes.  Patient reports he does not do physical activity frequently does not know if he normally has chest pain with activity.  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  HPI  Past Medical History:  Diagnosis Date  . Bronchitis   . Coronary artery disease   . Hypertension     Patient Active Problem List   Diagnosis Date Noted  . Essential hypertension 06/02/2018    Past Surgical History:  Procedure Laterality Date  . CHOLECYSTECTOMY          Home Medications    Prior to Admission medications   Medication Sig Start Date End Date Taking? Authorizing Provider  cetirizine (ZYRTEC) 10 MG chewable tablet Chew 1 tablet (10 mg total) by mouth daily. 12/10/17   Wurst, [place], PA-C  enalapril (VASOTEC) 5 MG tablet Take 1 tablet (5 mg total) by mouth daily. 06/02/18   Eustace Moore, MD  ibuprofen (ADVIL,MOTRIN) 800 MG tablet Take 1 tablet (800 mg total) by mouth 3 (three) times daily. 06/02/18   Eustace Moore, MD  ondansetron (ZOFRAN) 4 MG tablet Take 1 tablet (4 mg total) by mouth every 6 (six) hours. 06/02/18   Eustace Moore, MD    Family History Family History  Problem Relation Age of Onset  . Hypertension Mother   . Diabetes Mother   . Hypertension Father     Social History Social History   Tobacco Use  . Smoking  status: Current Some Day Smoker  . Smokeless tobacco: Never Used  Substance Use Topics  . Alcohol use: Yes  . Drug use: Yes    Types: Marijuana     Allergies   Patient has no known allergies.   Review of Systems Review of Systems  All other systems reviewed and are negative.    Physical Exam Updated Vital Signs BP (!) 162/112   Pulse 93   Temp 98.4 F (36.9 C) (Oral)   Resp 20   Ht 5\' 7"  (1.702 m)   Wt 68 kg   SpO2 99%   BMI 23.49 kg/m   Physical Exam  Constitutional: He appears well-developed and well-nourished.  HENT:  Head: Normocephalic and  atraumatic.  Right Ear: External ear normal.  Left Ear: External ear normal.  Nose: Nose normal.  Mouth/Throat: Uvula is midline, oropharynx is clear and moist and mucous membranes are normal. No tonsillar exudate.  Eyes: Pupils are equal, round, and reactive to light. Right eye exhibits no discharge. Left eye exhibits no discharge. No scleral icterus.  Neck: Trachea normal. Neck supple. No spinous process tenderness present. No neck rigidity. Normal range of motion present.  Cardiovascular: Normal rate, regular rhythm and intact distal pulses.  No murmur heard. Pulses:      Radial pulses are 2+ on the right side, and 2+ on the left side.       Dorsalis pedis pulses are 2+ on the right side, and 2+ on the left side.       Posterior tibial pulses are 2+ on the right side, and 2+ on the left side.  No lower extremity swelling or edema. Calves symmetric in size bilaterally.  Pulmonary/Chest: Effort normal and breath sounds normal. He exhibits no tenderness.  Abdominal: Soft. Bowel sounds are normal. There is tenderness in the epigastric area. There is no rigidity, no rebound and no guarding.  Musculoskeletal: He exhibits no edema.  Lymphadenopathy:    He has no cervical adenopathy.  Neurological: He is alert.  Skin: Skin is warm and dry. No rash noted. He is not diaphoretic.  Psychiatric: He has a normal mood and affect.  Nursing note and vitals reviewed.    ED Treatments / Results  Labs (all labs ordered are listed, but only abnormal results are displayed) Labs Reviewed  COMPREHENSIVE METABOLIC PANEL - Abnormal; Notable for the following components:      Result Value   Potassium 3.2 (*)    Glucose, Bld 117 (*)    Calcium 8.8 (*)    All other components within normal limits  LIPASE, BLOOD - Abnormal; Notable for the following components:   Lipase 166 (*)    All other components within normal limits  RAPID URINE DRUG SCREEN, HOSP PERFORMED - Abnormal; Notable for the following  components:   Tetrahydrocannabinol POSITIVE (*)    All other components within normal limits  I-STAT CHEM 8, ED - Abnormal; Notable for the following components:   Potassium 3.1 (*)    Glucose, Bld 113 (*)    Calcium, Ion 1.11 (*)    All other components within normal limits  CBC WITH DIFFERENTIAL/PLATELET  I-STAT TROPONIN, ED    EKG EKG Interpretation  Date/Time:  Sunday June 05 2018 17:51:31 EST Ventricular Rate:  89 PR Interval:    QRS Duration: 102 QT Interval:  378 QTC Calculation: 460 R Axis:   59 Text Interpretation:  Sinus rhythm RSR' in V1 or V2, right VCD or RVH Minimal ST elevation, anterior leads  Confirmed by Kristine RoyalMessick, Peter [phone]([id number]) on 06/05/2018 5:56:01 PM   Radiology Dg Chest 2 View  Result Date: 06/05/2018 CLINICAL DATA:  Chest pain, shortness of breath x1 week EXAM: CHEST - 2 VIEW COMPARISON:  None. FINDINGS: Lungs are clear.  No pleural effusion or pneumothorax. The heart is normal in size. Visualized osseous structures are within normal limits. IMPRESSION: Normal chest radiographs. Electronically Signed   By: Charline BillsSriyesh  Krishnan M.D.   On: 06/05/2018 19:15   Ct Angio Chest/abd/pel For Dissection W And/or W/wo  Result Date: 06/05/2018 CLINICAL DATA:  Chest pain radiating to the back. EXAM: CT ANGIOGRAPHY CHEST, ABDOMEN AND PELVIS TECHNIQUE: Multidetector CT imaging through the chest, abdomen and pelvis was performed using the standard protocol during bolus administration of intravenous contrast. Multiplanar reconstructed images and MIPs were obtained and reviewed to evaluate the vascular anatomy. CONTRAST:  100mL ISOVUE-370 IOPAMIDOL (ISOVUE-370) INJECTION 76% COMPARISON:  None. FINDINGS: CTA CHEST FINDINGS Cardiovascular: The heart size is normal. No substantial pericardial effusion. Precontrast imaging shows no hyperdense crescent in the wall of the thoracic aorta to suggest acute intramural hematoma. No thoracic aortic aneurysm. No dissection of the thoracic aorta.  Pulmonary arteries are well opacified without filling defect to suggest acute pulmonary embolus. Mediastinum/Nodes: No mediastinal lymphadenopathy. There is no hilar lymphadenopathy. The esophagus has normal imaging features. There is no axillary lymphadenopathy. Lungs/Pleura: The central tracheobronchial airways are patent. No focal airspace consolidation. No suspicious pulmonary nodule or mass. No pleural effusion. Musculoskeletal: No worrisome lytic or sclerotic osseous abnormality. Review of the MIP images confirms the above findings. CTA ABDOMEN AND PELVIS FINDINGS VASCULAR Aorta: Normal abdominal aorta without atherosclerosis, aneurysm, or dissection. Celiac: Widely patent with normal branch vessel anatomy. SMA: Widely patent. Renals: Single widely patent renal arteries bilaterally IMA: Widely patent. Inflow: Widely patent bilaterally. Veins: Not well evaluated due to bolus timing. Review of the MIP images confirms the above findings. NON-VASCULAR Hepatobiliary: No focal abnormality within the liver parenchyma. Gallbladder surgically absent. No intrahepatic or extrahepatic biliary dilation. Pancreas: No focal mass lesion. No dilatation of the main duct. No intraparenchymal cyst. No peripancreatic edema. Spleen: No splenomegaly. No focal mass lesion. Adrenals/Urinary Tract: No adrenal nodule or mass. Kidneys unremarkable. No evidence for hydroureter. The urinary bladder appears normal for the degree of distention. Stomach/Bowel: Stomach is nondistended. No gastric wall thickening. No evidence of outlet obstruction. Duodenum is normally positioned as is the ligament of Treitz. No small bowel wall thickening. No small bowel dilatation. The terminal ileum is normal. The appendix is normal. Scattered areas of peristalsis noted in the colon, otherwise unremarkable in appearance. Lymphatic: There is no gastrohepatic or hepatoduodenal ligament lymphadenopathy. No intraperitoneal or retroperitoneal lymphadenopathy. No  pelvic sidewall lymphadenopathy. Reproductive: The prostate gland and seminal vesicles have normal imaging features. Other: No intraperitoneal free fluid. Musculoskeletal: No worrisome lytic or sclerotic osseous abnormality. Review of the MIP images confirms the above findings. IMPRESSION: 1. Unremarkable study. Specifically, no evidence for thoracoabdominal aortic aneurysm or dissection. No CT evidence for acute pulmonary embolus. No findings to explain the patient's history of chest pain and shortness of breath. Electronically Signed   By: Kennith CenterEric  Mansell M.D.   On: 06/05/2018 20:46    Procedures Procedures (including critical care time)  Medications Ordered in ED Medications  morphine 4 MG/ML injection 4 mg (4 mg Intravenous Given 06/05/18 1935)  iopamidol (ISOVUE-370) 76 % injection 100 mL (100 mLs Intravenous Contrast Given 06/05/18 1958)  ondansetron (ZOFRAN) injection 4 mg (4 mg Intravenous Given 06/05/18 2130)  sodium chloride 0.9 % bolus 1,000 mL (0 mLs Intravenous Stopped 06/05/18 2224)  morphine 4 MG/ML injection 4 mg (4 mg Intravenous Given 06/05/18 2131)     Initial Impression / Assessment and Plan / ED Course  I have reviewed the triage vital signs and the nursing notes.  Pertinent labs & imaging results that were available during my care of the patient were reviewed by me and considered in my medical decision making (see chart for details).     42 y.o. male presenting for hypertension, chest pain, shortness of breath, abdominal pain and nausea.   Patient workup is consistent with pancreatitis.  He has had prior cholecystectomy.  LFTs normal.  No evidence of complication on CT. evidence of abscess or necrosis.  Do not suspect ACS.  EKG NSR.  Troponin within normal limits.  Suspect patient's pain was secondary to pancreatitis.  CT scan was otherwise unremarkable.  No evidence of dissection or PE.  Patient's pain is controlled in the department.  No intractable vomiting.  Discussed  inpatient versus outpatient treatment of patient's symptoms.  He would like outpatient treatment.  Discussed reasons to return to the emergency department.  Will plan for pain medication, nausea medication, low-fat/liquid diet, and follow-up with GI specialist. Specific return precautions discussed. Time was given for all questions to be answered. The patient verbalized understanding and agreement with plan. The patient appears safe for discharge home.  Patient case discussed with Dr. Rodena Medin who is in agreement with plan.  Final Clinical Impressions(s) / ED Diagnoses   Final diagnoses:  Acute pancreatitis without infection or necrosis, unspecified pancreatitis type  Nonspecific chest pain    ED Discharge Orders    None       Princella Pellegrini 06/06/18 2242    Wynetta Fines, MD 06/11/18 [phone]

## 2018-06-05 NOTE — ED Notes (Signed)
EDP at bedside  

## 2018-06-05 NOTE — ED Triage Notes (Signed)
Pt presents to ED for eval of CP and SOB x1 week, pt states pain radiates to his back. Endorses intermittent tingling of his hands. Pt states last time he felt like this he was dx with bronchitis. Pt A+Ox4, skin warm and dry.

## 2018-06-05 NOTE — Discharge Instructions (Signed)
Your labs are consistent with pancreatitis. Pancreatitis is a is a condition in which the pancreas suddenly becomes irritated and swollen (has inflammation). The pancreas is a gland that is located behind the stomach. It produces enzymes that help to digest food. The pancreas also releases the hormones glucagon and insulin, which help to regulate blood sugar. Damage to the pancreas occurs when the digestive enzymes from the pancreas are activated before they are released into the intestine. This usually occurs after drinking alcohol.  We discussed inpatient versus outpatient treatment for this.  He elected outpatient treatment.  If you develop any worsening abdominal pain despite medication, vomiting despite medication, or any other worsening symptoms you can return to the emergency department for further evaluation and treatment.  Please follow attached handouts on diet over the next 1 week.  I would like you to be on a low-fat, liquid diet. Please avoid all alcohol Take zofran as needed for nausea.    For breakthrough pain you may take Norco. Do not drink alcohol drive or operate heavy machinery when taking. You are being provided a prescription for opiates (also known as narcotics) for pain control on an as needed basis.  Opiates can be addictive and should only be used when absolutely necessary for pain control when other alternatives do not work.  We recommend you only use them for the recommended amount of time and only as prescribed.  Please do not take with other sedative medications or alcohol.  Please do not drive, operate machinery, or make important decisions while taking opiates.  Please note that these medications can be addictive and have high abuse potential.  Please keep these medications locked away from children, teenagers or any family members with history of substance abuse. Additionally, these medications may cause constipation - take over the counter stool softeners or add fiber to  your diet to treat this (Metamucil, Psyllium Fiber, Colace, Miralax) Further refills will need to be obtained from your primary care doctor and will not be prescribed through the Emergency Department. You will test positive on most drug tests while taking this medication.   Please follow up with GI (gastroenterologist). Please call tomorrow to schedule an appointment.   If you develop worsening or new concerning symptoms you can return to the emergency department for re-evaluation. Return sooner if You cannot eat or keep fluids down. Your pain becomes severe. Your skin or the white part of your eyes turns yellow (jaundice). You vomit. You feel dizzy or you faint. Your blood sugar is high (over 300 mg/dL).

## 2018-06-06 ENCOUNTER — Encounter: Payer: Self-pay | Admitting: Gastroenterology

## 2018-06-10 ENCOUNTER — Encounter: Payer: Self-pay | Admitting: Gastroenterology

## 2018-06-10 ENCOUNTER — Other Ambulatory Visit (INDEPENDENT_AMBULATORY_CARE_PROVIDER_SITE_OTHER): Payer: Managed Care, Other (non HMO)

## 2018-06-10 ENCOUNTER — Ambulatory Visit (INDEPENDENT_AMBULATORY_CARE_PROVIDER_SITE_OTHER): Payer: Managed Care, Other (non HMO) | Admitting: Gastroenterology

## 2018-06-10 VITALS — BP 114/90 | HR 82 | Ht 66.0 in | Wt 139.0 lb

## 2018-06-10 DIAGNOSIS — K859 Acute pancreatitis without necrosis or infection, unspecified: Secondary | ICD-10-CM

## 2018-06-10 LAB — COMPREHENSIVE METABOLIC PANEL
ALT: 28 U/L (ref 0–53)
AST: 22 U/L (ref 0–37)
Albumin: 4.6 g/dL (ref 3.5–5.2)
Alkaline Phosphatase: 64 U/L (ref 39–117)
BUN: 16 mg/dL (ref 6–23)
CO2: 27 mEq/L (ref 19–32)
Calcium: 9.5 mg/dL (ref 8.4–10.5)
Chloride: 104 mEq/L (ref 96–112)
Creatinine, Ser: 1.03 mg/dL (ref 0.40–1.50)
GFR: 102.77 mL/min (ref >60.00)
Glucose, Bld: 105 mg/dL — ABNORMAL HIGH (ref 70–99)
Potassium: 4.3 mEq/L (ref 3.5–5.1)
Sodium: 139 mEq/L (ref 135–145)
Total Bilirubin: 0.3 mg/dL (ref 0.2–1.2)
Total Protein: 7.1 g/dL (ref 6.0–8.3)

## 2018-06-10 LAB — LIPASE: LIPASE: 36 U/L (ref 11.0–59.0)

## 2018-06-10 LAB — CBC WITH DIFFERENTIAL/PLATELET
Basophils Absolute: 0.1 10*3/uL (ref 0.0–0.1)
Basophils Relative: 1.2 % (ref 0.0–3.0)
Eosinophils Absolute: 0.4 10*3/uL (ref 0.0–0.7)
Eosinophils Relative: 3.4 % (ref 0.0–5.0)
HCT: 46.8 % (ref 39.0–52.0)
Hemoglobin: 15.3 g/dL (ref 13.0–17.0)
Lymphocytes Relative: 35.1 % (ref 12.0–46.0)
Lymphs Abs: 3.7 10*3/uL (ref 0.7–4.0)
MCHC: 32.7 g/dL (ref 30.0–36.0)
MCV: 81.9 fl (ref 78.0–100.0)
Monocytes Absolute: 0.7 10*3/uL (ref 0.1–1.0)
Monocytes Relative: 7 % (ref 3.0–12.0)
Neutro Abs: 5.6 10*3/uL (ref 1.4–7.7)
Neutrophils Relative %: 53.3 % (ref 43.0–77.0)
Platelets: 388 10*3/uL (ref 150.0–400.0)
RBC: 5.71 Mil/uL (ref 4.22–5.81)
RDW: 13.5 % (ref 11.5–15.5)
WBC: 10.6 10*3/uL — ABNORMAL HIGH (ref 4.0–10.5)

## 2018-06-10 LAB — TRIGLYCERIDES: Triglycerides: 106 mg/dL (ref 0.0–149.0)

## 2018-06-10 MED ORDER — PANTOPRAZOLE SODIUM 40 MG PO TBEC: 40.0000 mg | DELAYED_RELEASE_TABLET | Freq: Every day | ORAL | 3 refills | Status: AC

## 2018-06-10 NOTE — Progress Notes (Signed)
Referring Provider: No ref. provider found Primary Care Physician:  Patient, [name]   Reason for Consultation:  Acute pancreatitis   IMPRESSION:  Recurrent, acute pancreatitis    - lipase 166 06/05/18 Prior cholecystectomy for pancreatitis +/- biliary dykinesia    - no history of gallstones Paternal ulcer with colon cancer in his 14s  Etiology of pancreatitis is unclear.    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 History obtained through the patient and review of his electronic health record.  Fever, chills, nausea, vomiting developed on Sunday. Abdominal pain and related back pain developed soon thereafter.  Unable to eat. Diagnosed with pancreatitis in the ED. Discharged home.  Having symptoms when not taking the medications, although overall improving.  Eating fruits and salads. No large meals. Nauseated with some foods - including spaghetti this morning. Good appetite. Some associated weight loss.No constipation or obstipation.  CTA chest/abd/pelvis in the ED was normal 06/05/18.   Previously on medications for cholesterol. Stopped them after his cholecystectomy. No OTC medications.   Cholesystectomy 2-3 years ago for biliary dyskinesia. Initially diagnosed with pancreatitis at that time.  Performed in [place]. Records are not available to me at this time.  No scorpions. No trauma. No tobacco. No alcohol. No cocaine. Occassional marijuana.  No family history of  pancreatitis, pancreatic cancer or sarcoid.   Paternal uncle with colon cancer at age 32. Father's history is unknown.   Labs 06/05/18: norma CMP except for K 3.2, glucose 117, calcium 8.8, lipase 166.      Past Medical History:  Diagnosis Date  . Bronchitis   . Coronary artery disease   . Hypertension     Past Surgical History:  Procedure Laterality Date  . CHOLECYSTECTOMY      Current Outpatient Medications  Medication Sig Dispense Refill  . cetirizine (ZYRTEC) 10 MG chewable tablet Chew 1 tablet (10 mg total) by mouth daily. 20 tablet 0  . enalapril (VASOTEC) 5 MG tablet Take 1 tablet (5 mg total) by mouth daily. 30 tablet 0   No current facility-administered medications for this visit.     Allergies as of 06/10/2018  . (No Known Allergies)    Family History  Problem Relation Age of Onset  . Hypertension Mother   . Diabetes Mother   . Hypertension Father   . Colon cancer Paternal Uncle     Social History   Socioeconomic History  . Marital status: Single    Spouse name: Not on file  . Number of children: 0  . Years of education: Not on file  . Highest education level: Not on file  Occupational History  . Not on file  Social Needs  . Financial resource strain: Not on file  . Food insecurity:    Worry: Not on file    Inability: Not on file  . Transportation needs:    Medical: Not on file    Non-medical: Not on file  Tobacco Use  . Smoking status: Current  Some Day Smoker  . Smokeless tobacco: Never Used  Substance and Sexual Activity  . Alcohol use: Yes  . Drug use: Yes    Types: Marijuana  . Sexual activity: Not on file  Lifestyle  . Physical activity:    Days per week: Not on file    Minutes per session: Not on file  . Stress: Not on file  Relationships  . Social connections:    Talks on phone: Not on file    Gets together: Not on file    Attends religious service: Not on file    Active member of club or organization: Not on file     Attends meetings of clubs or organizations: Not on file    Relationship status: Not on file  . Intimate partner violence:    Fear of current or ex partner: Not on file    Emotionally abused: Not on file    Physically abused: Not on file    Forced sexual activity: Not on file  Other Topics Concern  . Not on file  Social History Narrative  . Not on file    Review of Systems: 12 system ROS is negative except as noted above.  Filed Weights   06/10/18 1136  Weight: 139 lb (63 kg)    Physical Exam: Vital signs were reviewed. General:   Alert, well-nourished, pleasant and cooperative in NAD Head:  Normocephalic and atraumatic. Eyes:  Sclera clear, no icterus.   Conjunctiva pink. Mouth:  No deformity or lesions.   Neck:  Supple; no thyromegaly. Lungs:  Clear throughout to auscultation.   No wheezes.  Heart:  Regular rate and rhythm; no murmurs Abdomen:  Soft, nontender, normal bowel sounds. No rebound or guarding. No hepatosplenomegaly Rectal:  Deferred  Msk:  Symmetrical without gross deformities. Extremities:  No gross deformities or edema. Neurologic:  Alert and  oriented x4;  grossly nonfocal Skin:  No rash or bruise. Psych:  Alert and cooperative. Normal mood and affect.   Joel Kretz L. Orvan FalconerBeavers, MD, MPH [facility] 06/10/2018, 12:07 PM

## 2018-06-10 NOTE — Patient Instructions (Addendum)
Drink plenty of liquids this weekend. Reintroduce food on Monday.  Start taking pantoprazole 40 mg every morning about 30 minutes before breakfast.  I have recommended some labs today. Please stop smoking marijuana. I have also recommended an ultrasound to relook at the pancreas and an MRCP to look at the ducts/tubes around the pancreas.  If you start to feel worse and can't keep down liquids or solids, please go to the Emergency Room. You may need to be admitted to the hospital.    You have been scheduled for an abdominal ultrasound at [facility] (1st floor of hospital) on 06-21-18 at 8:30 am. Please arrive 15 minutes prior to your appointment for registration. Make certain not to have anything to eat or drink 6 hours prior to your appointment. Should you need to reschedule your appointment, please contact radiology at [phone]. This test typically takes about 30 minutes to perform.

## 2018-06-12 ENCOUNTER — Encounter: Payer: Self-pay | Admitting: Gastroenterology

## 2018-06-13 ENCOUNTER — Other Ambulatory Visit: Payer: Self-pay | Admitting: Gastroenterology

## 2018-06-21 ENCOUNTER — Ambulatory Visit (HOSPITAL_COMMUNITY)
Admission: RE | Admit: 2018-06-21 | Discharge: 2018-06-21 | Disposition: A | Discharge status: Home / Self Care | Payer: Managed Care, Other (non HMO) | Source: Ambulatory Visit | Attending: Gastroenterology | Admitting: Gastroenterology

## 2018-06-21 ENCOUNTER — Encounter (HOSPITAL_COMMUNITY): Payer: Self-pay

## 2018-06-21 ENCOUNTER — Ambulatory Visit (HOSPITAL_COMMUNITY): Admission: RE | Admit: 2018-06-21 | Payer: Managed Care, Other (non HMO) | Source: Ambulatory Visit

## 2018-06-21 ENCOUNTER — Other Ambulatory Visit: Payer: Self-pay | Admitting: Gastroenterology

## 2018-06-21 DIAGNOSIS — Z9049 Acquired absence of other specified parts of digestive tract: Secondary | ICD-10-CM | POA: Diagnosis not present

## 2018-06-21 DIAGNOSIS — K859 Acute pancreatitis without necrosis or infection, unspecified: Secondary | ICD-10-CM | POA: Diagnosis present

## 2018-06-21 MED ORDER — GADOBUTROL 1 MMOL/ML IV SOLN
6.0000 mL | Freq: Once | INTRAVENOUS | Status: AC | PRN
  Administered 2018-06-21: 6 mL via INTRAVENOUS

## 2018-06-22 ENCOUNTER — Encounter (HOSPITAL_COMMUNITY): Payer: Self-pay | Admitting: Emergency Medicine

## 2018-06-22 ENCOUNTER — Other Ambulatory Visit: Payer: Self-pay

## 2018-06-22 ENCOUNTER — Emergency Department (HOSPITAL_COMMUNITY)
Admission: EM | Admit: 2018-06-22 | Discharge: 2018-06-22 | Disposition: A | Discharge status: Home / Self Care | Payer: Managed Care, Other (non HMO) | Attending: Emergency Medicine | Admitting: Emergency Medicine

## 2018-06-22 DIAGNOSIS — I10 Essential (primary) hypertension: Secondary | ICD-10-CM | POA: Diagnosis not present

## 2018-06-22 DIAGNOSIS — R509 Fever, unspecified: Secondary | ICD-10-CM | POA: Insufficient documentation

## 2018-06-22 DIAGNOSIS — R112 Nausea with vomiting, unspecified: Secondary | ICD-10-CM | POA: Diagnosis not present

## 2018-06-22 DIAGNOSIS — F17200 Nicotine dependence, unspecified, uncomplicated: Secondary | ICD-10-CM | POA: Diagnosis not present

## 2018-06-22 DIAGNOSIS — I2510 Atherosclerotic heart disease of native coronary artery without angina pectoris: Secondary | ICD-10-CM | POA: Insufficient documentation

## 2018-06-22 DIAGNOSIS — J029 Acute pharyngitis, unspecified: Secondary | ICD-10-CM | POA: Diagnosis not present

## 2018-06-22 DIAGNOSIS — R6889 Other general symptoms and signs: Secondary | ICD-10-CM

## 2018-06-22 LAB — COMPREHENSIVE METABOLIC PANEL
ALT: 19 U/L (ref 0–44)
AST: 24 U/L (ref 15–41)
Albumin: 4.3 g/dL (ref 3.5–5.0)
Alkaline Phosphatase: 50 U/L (ref 38–126)
Anion gap: 8 (ref 5–15)
BUN: 10 mg/dL (ref 6–20)
CO2: 26 mmol/L (ref 22–32)
Calcium: 8.9 mg/dL (ref 8.9–10.3)
Chloride: 108 mmol/L (ref 98–111)
Creatinine, Ser: 0.95 mg/dL (ref 0.61–1.24)
GFR calc Af Amer: >60 mL/min (ref >60)
GFR calc non Af Amer: >60 mL/min (ref >60)
Glucose, Bld: 126 mg/dL — ABNORMAL HIGH (ref 70–99)
Potassium: 3.3 mmol/L — ABNORMAL LOW (ref 3.5–5.1)
Sodium: 142 mmol/L (ref 135–145)
Total Bilirubin: 0.8 mg/dL (ref 0.3–1.2)
Total Protein: 6.4 g/dL — ABNORMAL LOW (ref 6.5–8.1)

## 2018-06-22 LAB — CBC
HEMATOCRIT: 39.8 % (ref 39.0–52.0)
Hemoglobin: 12.9 g/dL — ABNORMAL LOW (ref 13.0–17.0)
MCH: 26.7 pg (ref 26.0–34.0)
MCHC: 32.4 g/dL (ref 30.0–36.0)
MCV: 82.2 fL (ref 80.0–100.0)
Platelets: 288 10*3/uL (ref 150–400)
RBC: 4.84 MIL/uL (ref 4.22–5.81)
RDW: 12.2 % (ref 11.5–15.5)
WBC: 9.6 10*3/uL (ref 4.0–10.5)
nRBC: 0 % (ref 0.0–0.2)

## 2018-06-22 LAB — LIPASE, BLOOD: Lipase: 29 U/L (ref 11–51)

## 2018-06-22 MED ORDER — ONDANSETRON 4 MG PO TBDP: 4.0000 mg | ORAL_TABLET | Freq: Three times a day (TID) | ORAL | 0 refills | Status: AC | PRN

## 2018-06-22 MED ORDER — IBUPROFEN 800 MG PO TABS: 800.0000 mg | ORAL_TABLET | Freq: Three times a day (TID) | ORAL | 0 refills | Status: AC

## 2018-06-22 MED ORDER — OSELTAMIVIR PHOSPHATE 75 MG PO CAPS: 75.0000 mg | ORAL_CAPSULE | Freq: Two times a day (BID) | ORAL | 0 refills | Status: AC

## 2018-06-22 MED ORDER — SODIUM CHLORIDE 0.9 % IV BOLUS
1000.0000 mL | Freq: Once | INTRAVENOUS | Status: AC
  Administered 2018-06-22: 1000 mL via INTRAVENOUS

## 2018-06-22 MED ORDER — BENZONATATE 100 MG PO CAPS: 100.0000 mg | ORAL_CAPSULE | Freq: Three times a day (TID) | ORAL | 0 refills | Status: AC

## 2018-06-22 MED ORDER — ONDANSETRON HCL 4 MG/2ML IJ SOLN
4.0000 mg | Freq: Once | INTRAMUSCULAR | Status: AC
  Administered 2018-06-22: 4 mg via INTRAVENOUS
  Filled 2018-06-22: qty 2

## 2018-06-22 NOTE — Discharge Instructions (Signed)
Take the prescribed medication as directed.  Make sure to rest and drink plenty of fluids. °Follow-up with your primary care doctor. °Return to the ED for new or worsening symptoms. °

## 2018-06-22 NOTE — ED Notes (Signed)
Requested patient to urinate. Patient states he does not have to go right now.

## 2018-06-22 NOTE — ED Provider Notes (Signed)
[facility]-EMERGENCY DEPT Provider Note   CSN: [id number] Arrival date & time: 06/22/18  0450     History   Chief Complaint Chief Complaint  Patient presents with  . Fever  . Chills  . Generalized Body Aches  . Emesis    HPI Joel Velazquez is a 42 y.o. male.  The history is provided by the patient and medical records.  Fever   Associated symptoms include diarrhea, vomiting, congestion and sore throat.  Emesis   Associated symptoms include chills, diarrhea, a fever and myalgias.     42 year old male with history of coronary artery disease, hypertension, pancreatitis, presenting to the ED with flulike symptoms upon waking at midnight.  Reports he feels feverish, has generalized body aches, cough, sore throat, nausea, vomiting, and diarrhea.  Cough is mostly dry, some mucous but not able to cough any up. He denies chest pain or SOB.  Denies abdominal pain, states stomach just feels "upset".  No sick contacts.  Did not get a flu vaccine this year.  He did take some nyquil PTA.  Past Medical History:  Diagnosis Date  . Bronchitis   . Coronary artery disease   . Hypertension     Patient Active Problem List   Diagnosis Date Noted  . Acute pancreatitis without infection or necrosis 06/05/2018  . Essential hypertension 06/02/2018    Past Surgical History:  Procedure Laterality Date  . CHOLECYSTECTOMY          Home Medications    Prior to Admission medications   Medication Sig Start Date End Date Taking? Authorizing Provider  cetirizine (ZYRTEC) 10 MG chewable tablet Chew 1 tablet (10 mg total) by mouth daily. 12/10/17  Yes Wurst, [place], PA-C  enalapril (VASOTEC) 5 MG tablet Take 1 tablet (5 mg total) by mouth daily. 06/02/18  Yes Eustace Moore, MD  pantoprazole (PROTONIX) 40 MG tablet Take 1 tablet (40 mg total) by mouth daily. 06/10/18  Yes Tressia Danas, MD    Family History Family History  Problem Relation Age of Onset  .  Hypertension Mother   . Diabetes Mother   . Hypertension Father   . Colon cancer Paternal Uncle     Social History Social History   Tobacco Use  . Smoking status: Current Some Day Smoker  . Smokeless tobacco: Never Used  Substance Use Topics  . Alcohol use: Yes  . Drug use: Yes    Types: Marijuana     Allergies   Patient has no known allergies.   Review of Systems Review of Systems  Constitutional: Positive for chills and fever.  HENT: Positive for congestion and sore throat.   Gastrointestinal: Positive for diarrhea, nausea and vomiting.  Musculoskeletal: Positive for myalgias.  All other systems reviewed and are negative.    Physical Exam Updated Vital Signs BP 124/74   Pulse (!) 105   Temp 98.4 F (36.9 C) (Oral)   Resp (!) 21   Ht 5\' 6"  (1.676 m)   Wt 58.5 kg   SpO2 97%   BMI 20.82 kg/m   Physical Exam Vitals signs and nursing note reviewed.  Constitutional:      Appearance: He is well-developed.     Comments: Appears to feel poorly but non-toxic  HENT:     Head: Normocephalic and atraumatic.     Right Ear: Tympanic membrane and ear canal normal.     Left Ear: Tympanic membrane and ear canal normal.     Nose: Congestion present.  Mouth/Throat:     Lips: Pink.     Mouth: Mucous membranes are moist.     Pharynx: No pharyngeal swelling or oropharyngeal exudate.  Eyes:     Conjunctiva/sclera: Conjunctivae normal.     Pupils: Pupils are equal, round, and reactive to light.  Neck:     Musculoskeletal: Normal range of motion.  Cardiovascular:     Rate and Rhythm: Normal rate and regular rhythm.     Heart sounds: Normal heart sounds.  Pulmonary:     Effort: Pulmonary effort is normal.     Breath sounds: Normal breath sounds. No decreased breath sounds, wheezing or rhonchi.  Abdominal:     General: Bowel sounds are normal.     Palpations: Abdomen is soft.     Tenderness: There is no abdominal tenderness. There is no left CVA tenderness or  rebound.  Musculoskeletal: Normal range of motion.  Skin:    General: Skin is warm and dry.     Comments: Warm to to the touch, not diaphoretic  Neurological:     Mental Status: He is alert and oriented to person, place, and time.      ED Treatments / Results  Labs (all labs ordered are listed, but only abnormal results are displayed) Labs Reviewed  COMPREHENSIVE METABOLIC PANEL - Abnormal; Notable for the following components:      Result Value   Potassium 3.3 (*)    Glucose, Bld 126 (*)    Total Protein 6.4 (*)    All other components within normal limits  CBC - Abnormal; Notable for the following components:   Hemoglobin 12.9 (*)    All other components within normal limits  LIPASE, BLOOD    EKG None  Radiology  Procedures Procedures (including critical care time)  Medications Ordered in ED Medications  sodium chloride 0.9 % bolus 1,000 mL (0 mLs Intravenous Stopped 06/22/18 0606)  ondansetron (ZOFRAN) injection 4 mg (4 mg Intravenous Given 06/22/18 0527)     Initial Impression / Assessment and Plan / ED Course  I have reviewed the triage vital signs and the nursing notes.  Pertinent labs & imaging results that were available during my care of the patient were reviewed by me and considered in my medical decision making (see chart for details).  42 year old male here with flulike symptoms upon waking.  He is afebrile and nontoxic in appearance here but does appear to be feeling unwell.  He is warm to the touch but not diaphoretic.  Does have some nasal congestion but exam is otherwise benign.  He is requesting to drink orange juice.  Given his reported nausea, vomiting, and diarrhea labs were obtained which are overall reassuring.  He was treated here with IV fluids as well as dose of Zofran with good improvement.  He continues tolerating oral fluids well.  Feel he stable for discharge home.  He is within the window to initiate Tamiflu therapy which he is open to.   Given him prescription for this as well as Zofran, Tessalon, Motrin.  Encouraged rest and good oral hydration.  Can follow-up with PCP.  Return here for any new/acute changes.  Final Clinical Impressions(s) / ED Diagnoses   Final diagnoses:  Flu-like symptoms    ED Discharge Orders         Ordered    oseltamivir (TAMIFLU) 75 MG capsule  Every 12 hours     06/22/18 0604    ondansetron (ZOFRAN ODT) 4 MG disintegrating tablet  Every 8 hours  PRN     06/22/18 0604    benzonatate (TESSALON) 100 MG capsule  Every 8 hours     06/22/18 0604    ibuprofen (ADVIL,MOTRIN) 800 MG tablet  3 times daily     06/22/18 0604           Garlon HatchetSanders, Seila Liston M, PA-C 06/22/18 [id number]    Glynn Octaveancour, Stephen, MD 06/22/18 [phone]

## 2018-06-22 NOTE — ED Triage Notes (Signed)
Patient is complaining of flu like symptoms x 12. Symptoms are fever, chills, body aches, cough, nausea vomiting, diarrhea, and sore throat.

## 2018-06-22 NOTE — ED Notes (Signed)
Bed: WA17 Expected date:  Expected time:  Means of arrival:  Comments: EMS 42 yo flu symptoms

## 2018-06-23 ENCOUNTER — Telehealth: Payer: Self-pay | Admitting: Gastroenterology

## 2018-06-23 NOTE — Telephone Encounter (Signed)
Pt called stating that he was returning somebody's call regarding results. Pls call him again.

## 2018-06-23 NOTE — Telephone Encounter (Signed)
Patient calling for results of MRI done on 12.24.19.

## 2018-06-23 NOTE — Telephone Encounter (Signed)
Spoke with pt and he is aware of results, see result note. 

## 2018-07-12 ENCOUNTER — Ambulatory Visit: Payer: Managed Care, Other (non HMO) | Admitting: Gastroenterology

## 2018-07-12 NOTE — Progress Notes (Deleted)
Referring Provider: No ref. provider found Primary Care Physician:  Patient, [name]   Reason for Consultation:  Acute pancreatitis   IMPRESSION:  Recurrent, acute pancreatitis    - lipase 166 06/05/18 Prior cholecystectomy for pancreatitis +/- biliary dykinesia    - no history of gallstones Paternal ulcer with colon cancer in his 80s  Etiology of pancreatitis is unclear.    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  The interval history is obtained through the patient and review of his electronic health record.  Fever, chills, nausea, vomiting developed on Sunday. Abdominal pain and related back pain developed soon thereafter.  Unable to eat. Diagnosed with pancreatitis in the ED. Discharged home.  Having symptoms when not taking the medications, although overall improving.  Eating fruits and salads. No large meals. Nauseated with some foods - including spaghetti this morning. Good appetite. Some associated weight loss.No constipation or obstipation.  CTA chest/abd/pelvis in the ED was normal 06/05/18.   Previously on medications for cholesterol. Stopped them after his cholecystectomy. No OTC medications.   Cholesystectomy 2-3 years ago for biliary dyskinesia. Initially diagnosed with pancreatitis at that time.  Performed in [place]. Records are not available to me at this time.  No scorpions. No trauma. No  tobacco. No alcohol. No cocaine. Occassional marijuana.  No family history of pancreatitis, pancreatic cancer or sarcoid.   Paternal uncle with colon cancer at age 24. Father's history is unknown.   Labs 06/05/18: norma CMP except for K 3.2, glucose 117, calcium 8.8, lipase 166.      Past Medical History:  Diagnosis Date  . Bronchitis   . Coronary artery disease   . Hypertension     Past Surgical History:  Procedure Laterality Date  . CHOLECYSTECTOMY      Current Outpatient Medications  Medication Sig Dispense Refill  . benzonatate (TESSALON) 100 MG capsule Take 1 capsule (100 mg total) by mouth every 8 (eight) hours. 21 capsule 0  . cetirizine (ZYRTEC) 10 MG chewable tablet Chew 1 tablet (10 mg total) by mouth daily. 20 tablet 0  . enalapril (VASOTEC) 5 MG tablet Take 1 tablet (5 mg total) by mouth daily. 30 tablet 0  . ibuprofen (ADVIL,MOTRIN) 800 MG tablet Take 1 tablet (800 mg total) by mouth 3 (three) times daily. 21 tablet 0  . ondansetron (ZOFRAN ODT) 4 MG disintegrating tablet Take 1 tablet (4 mg total) by mouth every 8 (eight) hours as needed for nausea. 10 tablet 0  . oseltamivir (TAMIFLU) 75 MG capsule Take 1 capsule (75 mg total) by mouth every 12 (twelve) hours. 10 capsule 0  . pantoprazole (PROTONIX) 40 MG tablet Take 1 tablet (40 mg total) by mouth daily. 30 tablet 3   No current facility-administered medications for this visit.     Allergies as of 07/12/2018  . (No Known Allergies)    Family History  Problem Relation Age of Onset  . Hypertension Mother   .  Diabetes Mother   . Hypertension Father   . Colon cancer Paternal Uncle     Social History   Socioeconomic History  . Marital status: Single    Spouse name: Not on file  . Number of children: 0  . Years of education: Not on file  . Highest education level: Not on file  Occupational History  . Not on file  Social Needs  . Financial resource strain: Not on file  . Food insecurity:    Worry:  Not on file    Inability: Not on file  . Transportation needs:    Medical: Not on file    Non-medical: Not on file  Tobacco Use  . Smoking status: Current Some Day Smoker  . Smokeless tobacco: Never Used  Substance and Sexual Activity  . Alcohol use: Yes  . Drug use: Yes    Types: Marijuana  . Sexual activity: Not on file  Lifestyle  . Physical activity:    Days per week: Not on file    Minutes per session: Not on file  . Stress: Not on file  Relationships  . Social connections:    Talks on phone: Not on file    Gets together: Not on file    Attends religious service: Not on file    Active member of club or organization: Not on file    Attends meetings of clubs or organizations: Not on file    Relationship status: Not on file  . Intimate partner violence:    Fear of current or ex partner: Not on file    Emotionally abused: Not on file    Physically abused: Not on file    Forced sexual activity: Not on file  Other Topics Concern  . Not on file  Social History Narrative  . Not on file    Review of Systems: 12 system ROS is negative except as noted above.  There were no vitals filed for this visit.  Physical Exam: Vital signs were reviewed. General:   Alert, well-nourished, pleasant and cooperative in NAD Head:  Normocephalic and atraumatic. Eyes:  Sclera clear, no icterus.   Conjunctiva pink. Mouth:  No deformity or lesions.   Neck:  Supple; no thyromegaly. Lungs:  Clear throughout to auscultation.   No wheezes.  Heart:  Regular rate and rhythm; no murmurs Abdomen:  Soft, nontender, normal bowel sounds. No rebound or guarding. No hepatosplenomegaly Rectal:  Deferred  Msk:  Symmetrical without gross deformities. Extremities:  No gross deformities or edema. Neurologic:  Alert and  oriented x4;  grossly nonfocal Skin:  No rash or bruise. Psych:  Alert and cooperative. Normal mood and affect.   Caroleen Stoermer L. Orvan Falconer, MD, MPH [facility] 07/12/2018, 1:17  PM

## 2019-05-26 IMAGING — CR DG CHEST 2V
2 series · 2 of 2 positions shown · non-contrast
Comparison: None.

CLINICAL DATA: Chest pain, shortness of breath x1 week

EXAM:
CHEST - 2 VIEW

[chest pa]
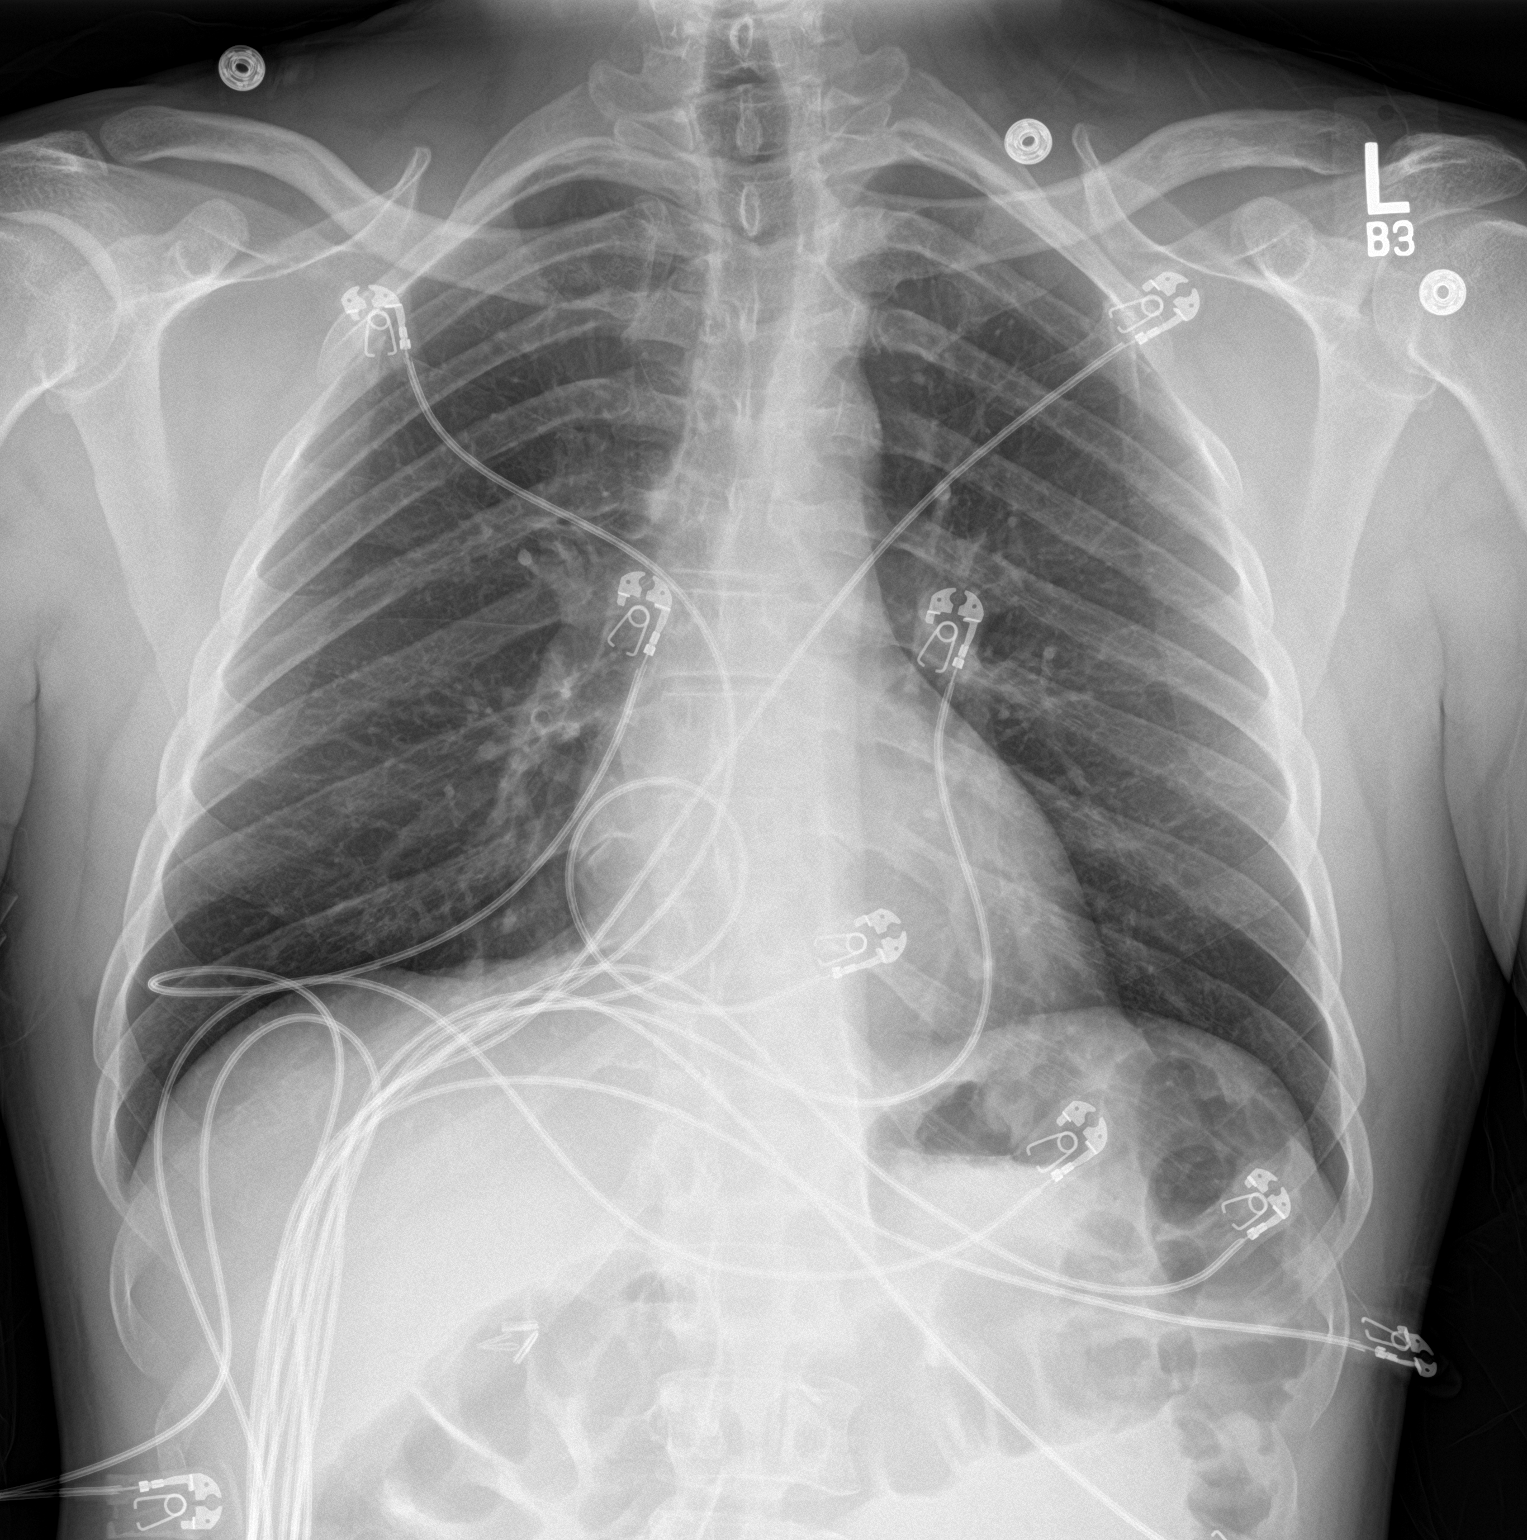

[chest lat]
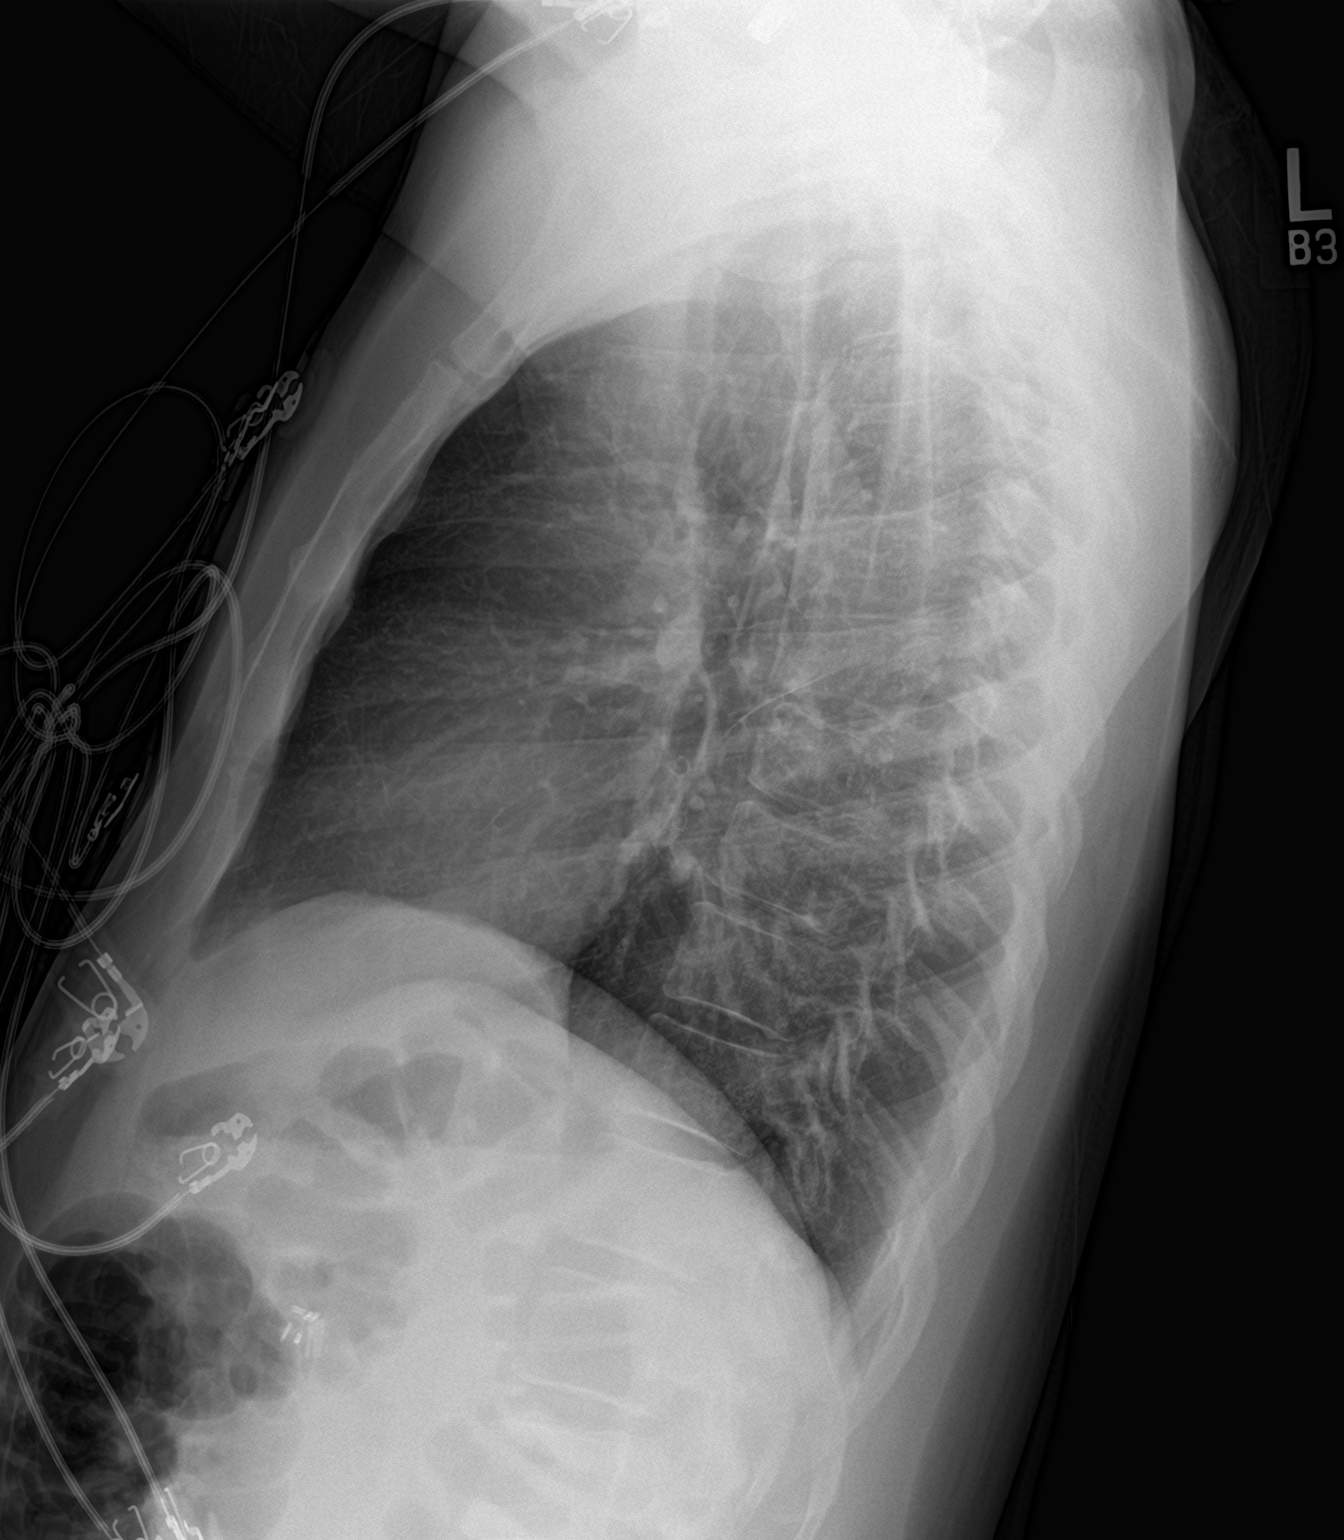

[2 of 2 positions shown; findings below may reference images not displayed]

FINDINGS: Lungs are clear.  No pleural effusion or pneumothorax.

The heart is normal in size.

Visualized osseous structures are within normal limits.
IMPRESSION: Normal chest radiographs.

## 2019-05-26 IMAGING — CT CT ANGIO CHEST-ABD-PELV FOR DISSECTION W/ AND WO/W CM
2 of 8 series · 13 of 46 positions shown, 15 images · IV contrast (OMNI 350)
Comparison: None.

CLINICAL DATA: Chest pain radiating to the back.

EXAM:
CT ANGIOGRAPHY CHEST, ABDOMEN AND PELVIS
TECHNIQUE: Multidetector CT imaging through the chest, abdomen and pelvis was
performed using the standard protocol during bolus administration of
intravenous contrast. Multiplanar reconstructed images and MIPs were
obtained and reviewed to evaluate the vascular anatomy.
CONTRAST:  100mL [id number]-[id number] IOPAMIDOL ([id number]) INJECTION 76%

[Series 8: dissection 2mm · axial · 0.75mm/px · z∈[+820,+1398]mm · 10 of 329 slices shown, 12 images]
[im 20/329  soft-tissue]
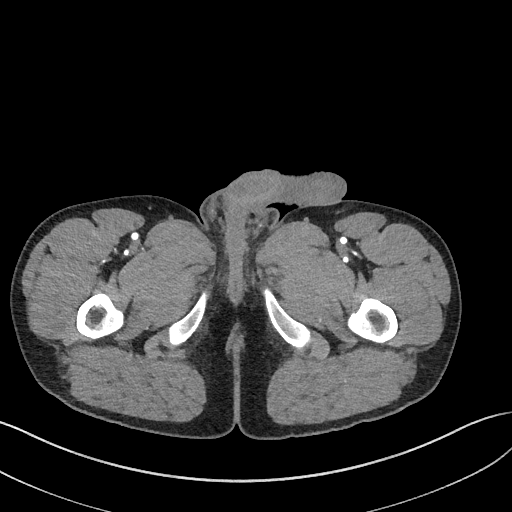
[im 20/329  bone]
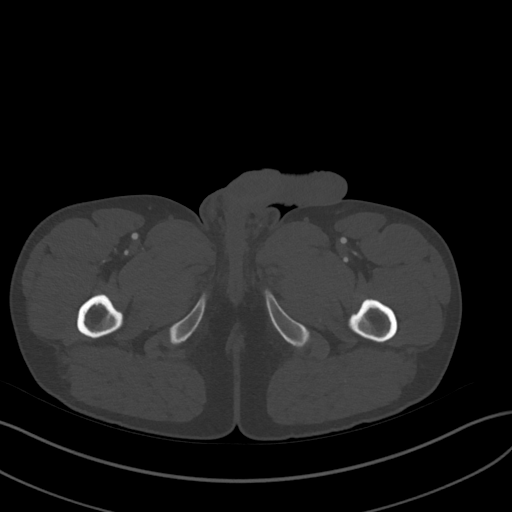
[im 58/329  soft-tissue]
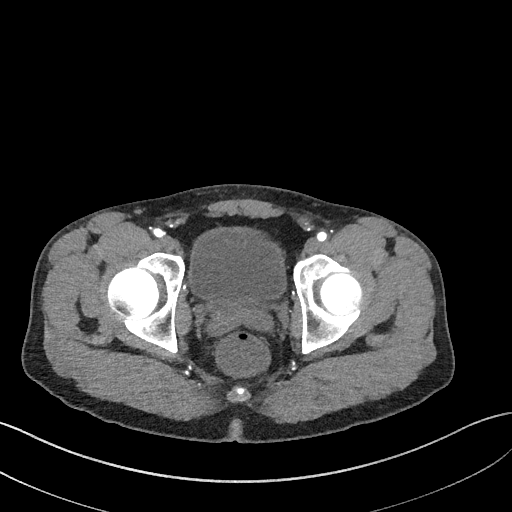
[im 97/329  soft-tissue]
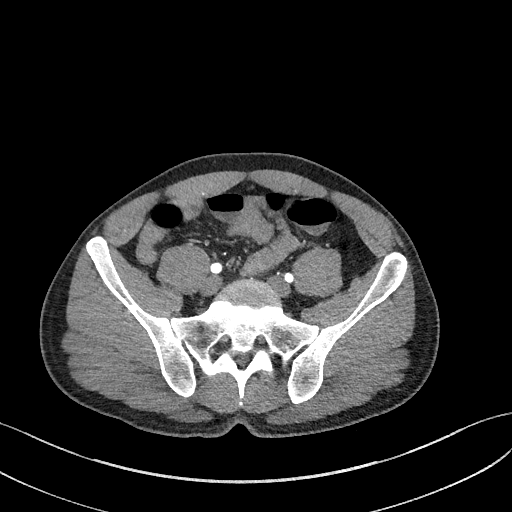
[im 116/329  soft-tissue]
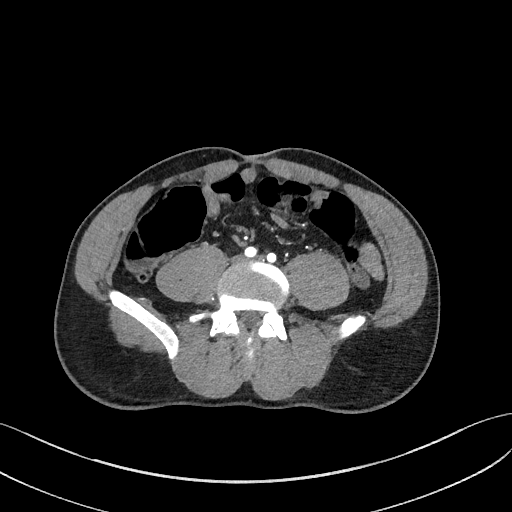
[im 155/329  soft-tissue]
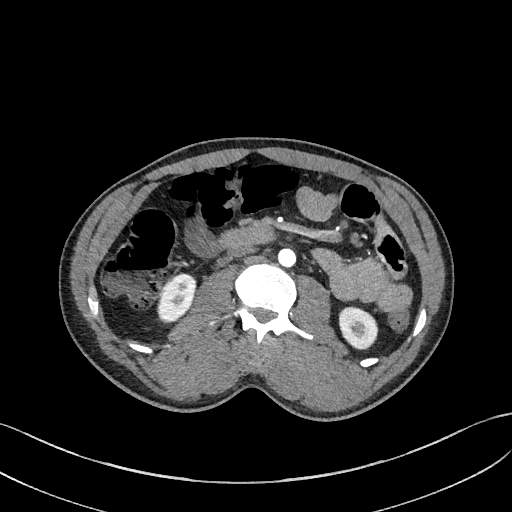
[im 174/329  soft-tissue]
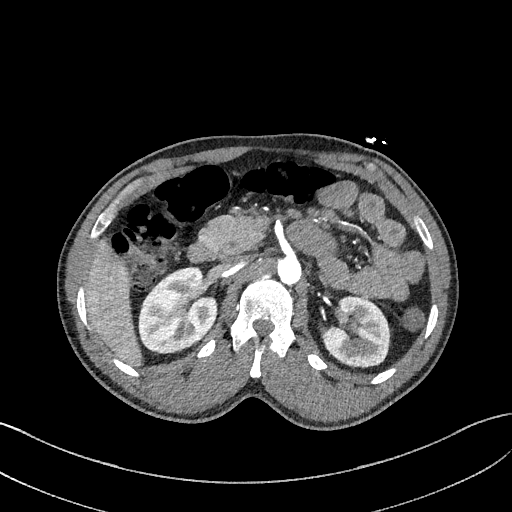
[im 213/329  soft-tissue]
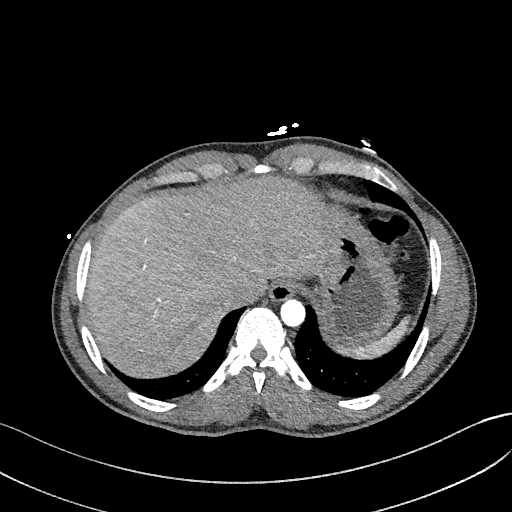
[im 251/329  soft-tissue]
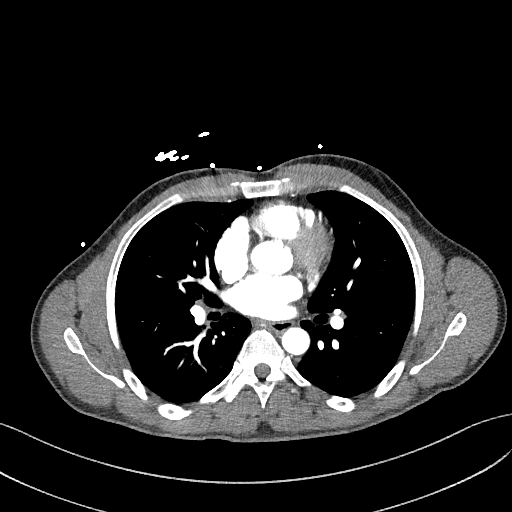
[im 271/329  soft-tissue]
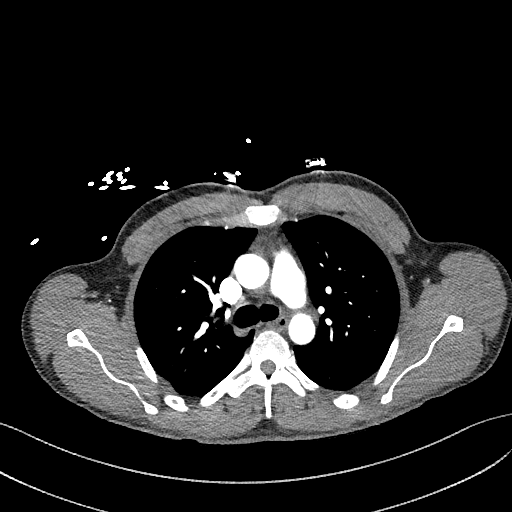
[im 271/329  bone]
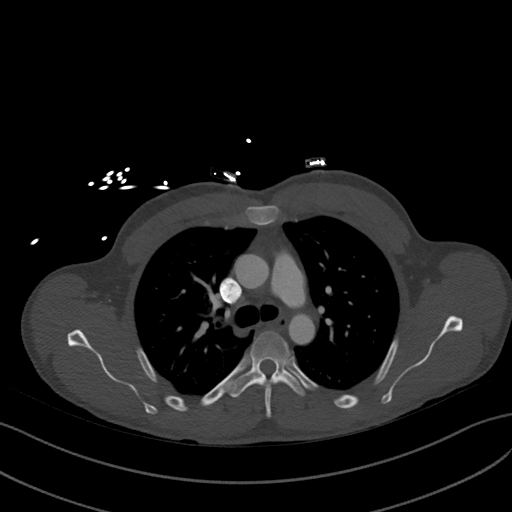
[im 309/329  soft-tissue]
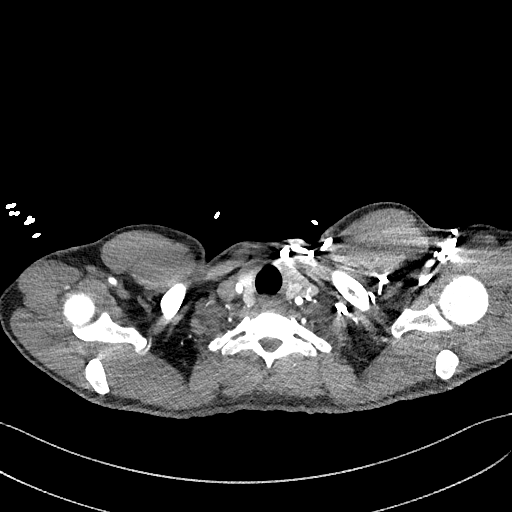

[Series 11: dissection 2mm cor · coronal · 0.68mm/px · 3 of 140 slices shown]
[im 35/140  soft-tissue]
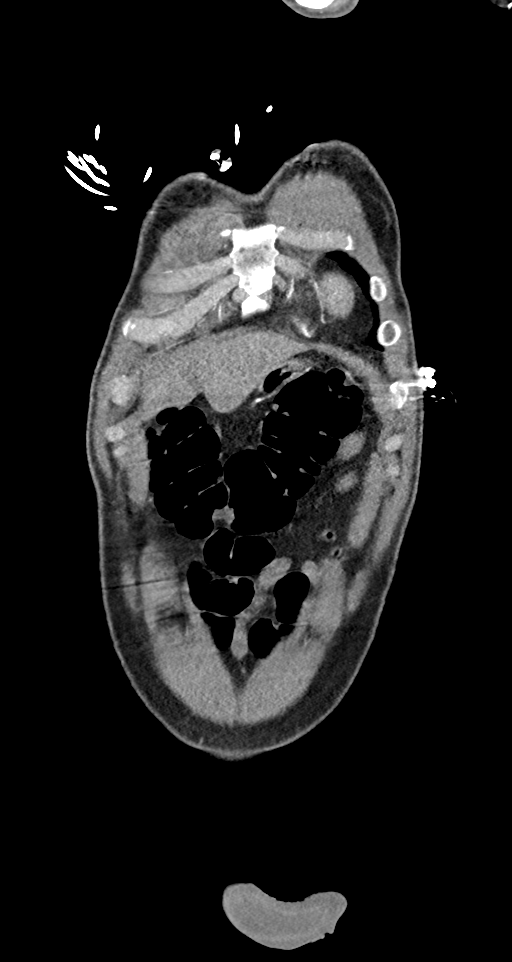
[im 70/140  soft-tissue]
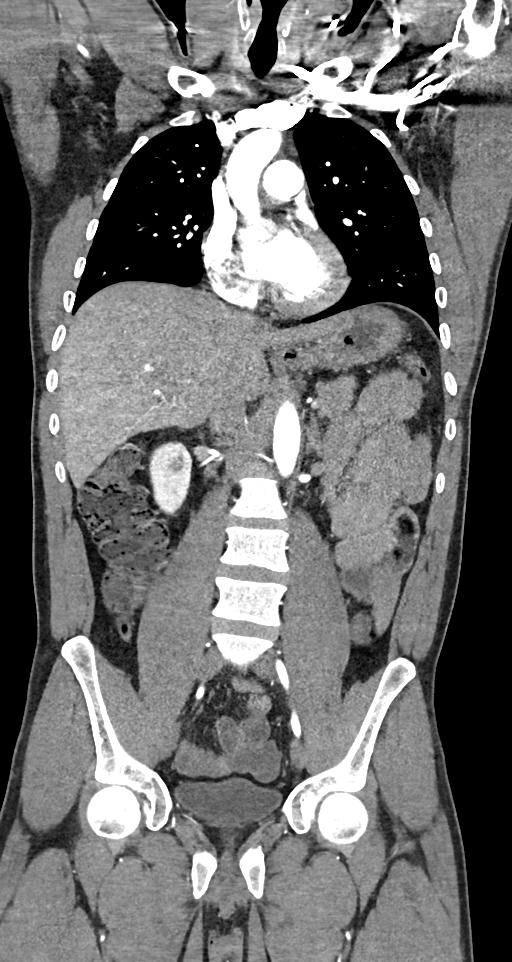
[im 105/140  soft-tissue]
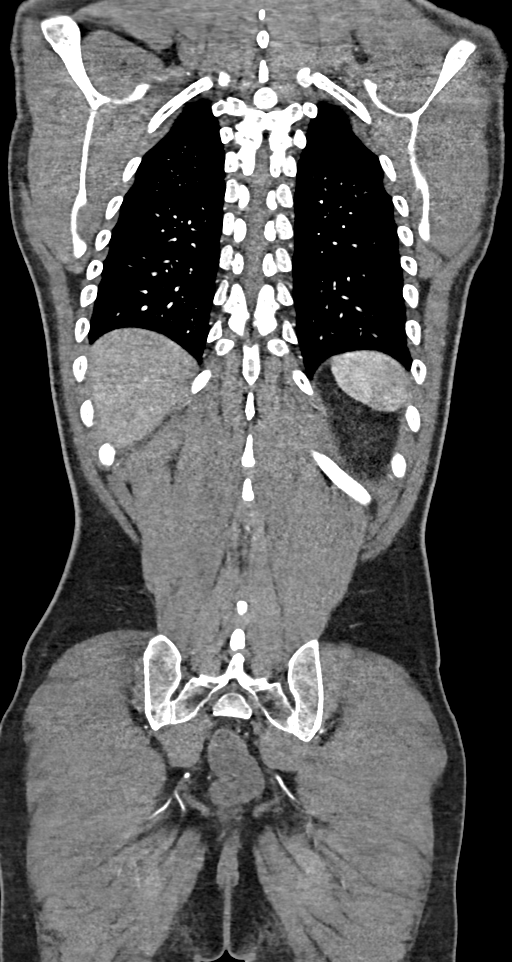

[13 of 46 positions shown; findings below may reference images not displayed]

FINDINGS: CTA CHEST FINDINGS

Cardiovascular: The heart size is normal. No substantial pericardial
effusion. Precontrast imaging shows no hyperdense crescent in the
wall of the thoracic aorta to suggest acute intramural hematoma. No
thoracic aortic aneurysm. No dissection of the thoracic aorta.
Pulmonary arteries are well opacified without filling defect to
suggest acute pulmonary embolus.

Mediastinum/Nodes: No mediastinal lymphadenopathy. There is no hilar
lymphadenopathy. The esophagus has normal imaging features. There is
no axillary lymphadenopathy.

Lungs/Pleura: The central tracheobronchial airways are patent. No
focal airspace consolidation. No suspicious pulmonary nodule or
mass. No pleural effusion.

Musculoskeletal: No worrisome lytic or sclerotic osseous
abnormality.

Review of the MIP images confirms the above findings.

CTA ABDOMEN AND PELVIS FINDINGS

VASCULAR

Aorta: Normal abdominal aorta without atherosclerosis, aneurysm, or
dissection.

Celiac: Widely patent with normal branch vessel anatomy.

SMA: Widely patent.

Renals: Single widely patent renal arteries bilaterally

IMA: Widely patent.

Inflow: Widely patent bilaterally.

Veins: Not well evaluated due to bolus timing.

Review of the MIP images confirms the above findings.

NON-VASCULAR

Hepatobiliary: No focal abnormality within the liver parenchyma.
Gallbladder surgically absent. No intrahepatic or extrahepatic
biliary dilation.

Pancreas: No focal mass lesion. No dilatation of the main duct. No
intraparenchymal cyst. No peripancreatic edema.

Spleen: No splenomegaly. No focal mass lesion.

Adrenals/Urinary Tract: No adrenal nodule or mass. Kidneys
unremarkable. No evidence for hydroureter. The urinary bladder
appears normal for the degree of distention.

Stomach/Bowel: Stomach is nondistended. No gastric wall thickening.
No evidence of outlet obstruction. Duodenum is normally positioned
as is the ligament of Treitz. No small bowel wall thickening. No
small bowel dilatation. The terminal ileum is normal. The appendix
is normal. Scattered areas of peristalsis noted in the colon,
otherwise unremarkable in appearance.

Lymphatic: There is no gastrohepatic or hepatoduodenal ligament
lymphadenopathy. No intraperitoneal or retroperitoneal
lymphadenopathy. No pelvic sidewall lymphadenopathy.

Reproductive: The prostate gland and seminal vesicles have normal
imaging features.

Other: No intraperitoneal free fluid.

Musculoskeletal: No worrisome lytic or sclerotic osseous
abnormality.

Review of the MIP images confirms the above findings.
IMPRESSION: 1. Unremarkable study. Specifically, no evidence for
thoracoabdominal aortic aneurysm or dissection. No CT evidence for
acute pulmonary embolus. No findings to explain the patient's
history of chest pain and shortness of breath.
